# Patient Record
Sex: Female | Born: 1985 | Race: White | Hispanic: No | Marital: Married | Smoking: Current every day smoker
Health system: Southern US, Community
[De-identification: ages and names within clinical notes are randomized; demographics above are authoritative.]

## PROBLEM LIST (undated history)

## (undated) ENCOUNTER — Inpatient Hospital Stay (HOSPITAL_COMMUNITY): Payer: Self-pay

## (undated) DIAGNOSIS — Z141 Cystic fibrosis carrier: Secondary | ICD-10-CM

## (undated) DIAGNOSIS — L0591 Pilonidal cyst without abscess: Secondary | ICD-10-CM

## (undated) DIAGNOSIS — F32A Depression, unspecified: Secondary | ICD-10-CM

## (undated) DIAGNOSIS — IMO0002 Reserved for concepts with insufficient information to code with codable children: Secondary | ICD-10-CM

## (undated) DIAGNOSIS — F329 Major depressive disorder, single episode, unspecified: Secondary | ICD-10-CM

## (undated) DIAGNOSIS — F1010 Alcohol abuse, uncomplicated: Secondary | ICD-10-CM

## (undated) DIAGNOSIS — F17200 Nicotine dependence, unspecified, uncomplicated: Secondary | ICD-10-CM

## (undated) DIAGNOSIS — R51 Headache: Secondary | ICD-10-CM

## (undated) DIAGNOSIS — A630 Anogenital (venereal) warts: Secondary | ICD-10-CM

## (undated) HISTORY — DX: Nicotine dependence, unspecified, uncomplicated: F17.200

## (undated) HISTORY — DX: Headache: R51

## (undated) HISTORY — DX: Reserved for concepts with insufficient information to code with codable children: IMO0002

## (undated) HISTORY — DX: Cystic fibrosis carrier: Z14.1

## (undated) HISTORY — DX: Anogenital (venereal) warts: A63.0

## (undated) HISTORY — DX: Pilonidal cyst without abscess: L05.91

## (undated) HISTORY — PX: NO PAST SURGERIES: SHX2092

---
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
## 2007-11-15 ENCOUNTER — Emergency Department (HOSPITAL_BASED_OUTPATIENT_CLINIC_OR_DEPARTMENT_OTHER): Admission: EM | Admit: 2007-11-15 | Discharge: 2007-11-15 | Payer: Self-pay | Admitting: Emergency Medicine

## 2009-01-29 ENCOUNTER — Emergency Department (HOSPITAL_COMMUNITY): Admission: EM | Admit: 2009-01-29 | Discharge: 2009-01-29 | Payer: Self-pay | Admitting: Emergency Medicine

## 2009-01-31 ENCOUNTER — Emergency Department (HOSPITAL_COMMUNITY): Admission: EM | Admit: 2009-01-31 | Discharge: 2009-01-31 | Payer: Self-pay | Admitting: Emergency Medicine

## 2009-02-25 ENCOUNTER — Emergency Department (HOSPITAL_COMMUNITY): Admission: EM | Admit: 2009-02-25 | Discharge: 2009-02-25 | Payer: Self-pay | Admitting: Emergency Medicine

## 2010-04-04 LAB — WOUND CULTURE

## 2010-05-12 ENCOUNTER — Inpatient Hospital Stay (HOSPITAL_COMMUNITY)
Admission: RE | Admit: 2010-05-12 | Discharge: 2010-05-14 | DRG: 775 | Disposition: A | Discharge status: Home / Self Care | Payer: Medicaid Other | Source: Ambulatory Visit | Attending: Obstetrics and Gynecology | Admitting: Obstetrics and Gynecology

## 2010-05-12 ENCOUNTER — Inpatient Hospital Stay (HOSPITAL_COMMUNITY): Admission: RE | Admit: 2010-05-12 | Payer: Self-pay | Source: Ambulatory Visit | Admitting: Obstetrics and Gynecology

## 2010-05-12 DIAGNOSIS — O480 Post-term pregnancy: Principal | ICD-10-CM | POA: Diagnosis present

## 2010-05-12 LAB — CBC
HCT: 31 % — ABNORMAL LOW (ref 36.0–46.0)
Hemoglobin: 10 g/dL — ABNORMAL LOW (ref 12.0–15.0)
MCH: 27.1 pg (ref 26.0–34.0)
MCHC: 32.3 g/dL (ref 30.0–36.0)
MCV: 84 fL (ref 78.0–100.0)
Platelets: 217 10*3/uL (ref 150–400)
RBC: 3.69 MIL/uL — ABNORMAL LOW (ref 3.87–5.11)
RDW: 14.3 % (ref 11.5–15.5)
WBC: 7.7 10*3/uL (ref 4.0–10.5)

## 2010-05-12 LAB — RPR: RPR Ser Ql: NONREACTIVE

## 2010-05-13 LAB — CBC
HCT: 24.5 % — ABNORMAL LOW (ref 36.0–46.0)
Hemoglobin: 7.9 g/dL — ABNORMAL LOW (ref 12.0–15.0)
MCH: 26.9 pg (ref 26.0–34.0)
MCV: 84.5 fL (ref 78.0–100.0)
RBC: 2.9 MIL/uL — ABNORMAL LOW (ref 3.87–5.11)

## 2010-05-16 ENCOUNTER — Inpatient Hospital Stay (HOSPITAL_COMMUNITY)
Admission: AD | Admit: 2010-05-16 | Discharge: 2010-05-16 | Disposition: A | Discharge status: Home / Self Care | Payer: Medicaid Other | Source: Ambulatory Visit | Attending: Obstetrics and Gynecology | Admitting: Obstetrics and Gynecology

## 2010-05-16 DIAGNOSIS — N949 Unspecified condition associated with female genital organs and menstrual cycle: Secondary | ICD-10-CM | POA: Insufficient documentation

## 2010-05-16 DIAGNOSIS — O9989 Other specified diseases and conditions complicating pregnancy, childbirth and the puerperium: Secondary | ICD-10-CM

## 2010-05-16 DIAGNOSIS — O99893 Other specified diseases and conditions complicating puerperium: Secondary | ICD-10-CM | POA: Insufficient documentation

## 2010-05-24 NOTE — Discharge Summary (Signed)
[name], [name]              ACCOUNT NO.:  [id number]  MEDICAL RECORD NO.:  [id number]           PATIENT TYPE:  I  LOCATION:  9109                          FACILITY:  [facility]  PHYSICIAN:  Huel Cote, M.D. DATE OF BIRTH:  [birth year]  DATE OF ADMISSION:  05/12/2010 DATE OF DISCHARGE:  05/14/2010                              DISCHARGE SUMMARY   DISCHARGE DIAGNOSES: 1. Term pregnancy at 40-3/7 weeks. 2. Status post normal spontaneous delivery.  DISCHARGE MEDICATIONS: 1. Motrin 600 mg p.o. every 6 hours p.r.n. 2. Percocet 1-2 tablets p.o. every 4 hours p.r.n.  DISCHARGE FOLLOWUP:  The patient is to follow up in the office in 6 weeks for her usual postpartum exam.  HOSPITAL COURSE:  The patient is a 25 year old G1, P0 who was admitted at 40-3/[redacted] weeks gestation for induction of labor given post due date. 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  Father of baby was not tested.  History of alcoholism in the past but the patient has been sober for 2 years.  She is also a smoker but has decreased to about 10 cigarettes a day.  She does have a history of depression in the past and a history of suicide attempts in the past, however, these were not an active issue during the pregnancy and she had required no medications during the pregnancy.  PRENATAL LABS:  A+ antibody negative, rubella immune, hepatitis B surface antigen negative, HIV negative, GC negative, Chlamydia negative, RPR nonreactive, cystic fibrosis positive, 1-hour Glucola 78, first trimester screen negative, group B strep negative.  PAST OBSTETRICAL HISTORY:  None.  PAST GYN HISTORY:  An abnormal Pap smear in 2009.  This has resolved by the Pap smear done this pregnancy.  PAST SURGICAL HISTORY:  Pilonidal cyst in 2010.  PAST MEDICAL HISTORY:  History of alcoholism but the patient is sober for 2 years, history of migraines, also a remote history of depression and suicide  attempt.  ALLERGIES:  SULFA.  MEDICATIONS:  Prenatal vitamins.  The patient is single with father of the baby involved.  She does not drink or use drugs.  She does smoke.  On admission, she was afebrile with stable vital signs.  Fetal heart rate was reactive.  Cervix was 70, 1-2, and a -1 station.  She had low- dose Pitocin begun and rupture of membranes performed and progressed very quickly to complete dilation.  She pushed well with a normal spontaneous vaginal delivery of a viable female infant over small first- degree laceration.  There is nuchal cord x1 reduced over the head. Apgar's were 9 and 9, weight was 6 pounds 10 ounces.  There were small bilateral labial lacerations which were repaired as well as a first- degree perineal laceration with 3-0 Vicryl for hemostasis.  She was admitted for routine postpartum care.  On postpartum day #1, hemoglobin was 7.9 down from 10, she was ambulating well and having no symptoms of anemia.  Fundus was firm and her lochia had normalized.  On postpartum day #2, she was still doing well.  She was slightly tearful and we had a long  discussion about postpartum depression and baby blues.  She understands she is to call if her depression seems to be getting worse after the first week of being home.  She is afebrile with stable vital signs.  Her fundus was firm.  She was given prescriptions for Motrin and Percocet and instructed to follow up in the office in 6 weeks and her family was also made aware of any issues that they felt were with depression.  She was also scheduled to have a social work consult later today before discharge.     Huel Cote, M.D.     KR/MEDQ  D:  05/14/2010  T:  05/14/2010  Job:  [id number]  Electronically Signed by Huel Cote M.D. on 05/24/2010 09:15:12 AM

## 2010-06-29 ENCOUNTER — Emergency Department (HOSPITAL_COMMUNITY)
Admission: EM | Admit: 2010-06-29 | Discharge: 2010-06-29 | Disposition: A | Discharge status: Home / Self Care | Payer: Medicaid Other | Attending: Emergency Medicine | Admitting: Emergency Medicine

## 2010-06-29 DIAGNOSIS — H53149 Visual discomfort, unspecified: Secondary | ICD-10-CM | POA: Insufficient documentation

## 2010-06-29 DIAGNOSIS — R110 Nausea: Secondary | ICD-10-CM | POA: Insufficient documentation

## 2010-06-29 DIAGNOSIS — R51 Headache: Secondary | ICD-10-CM | POA: Insufficient documentation

## 2010-06-29 LAB — POCT I-STAT, CHEM 8
BUN: 20 mg/dL (ref 6–23)
Creatinine, Ser: 0.7 mg/dL (ref 0.4–1.2)
Sodium: 141 mEq/L (ref 135–145)
TCO2: 24 mmol/L (ref 0–100)

## 2010-09-29 ENCOUNTER — Emergency Department (HOSPITAL_COMMUNITY)
Admission: EM | Admit: 2010-09-29 | Discharge: 2010-09-29 | Disposition: A | Discharge status: Home / Self Care | Payer: Medicaid Other | Attending: Emergency Medicine | Admitting: Emergency Medicine

## 2010-09-29 DIAGNOSIS — G43909 Migraine, unspecified, not intractable, without status migrainosus: Secondary | ICD-10-CM | POA: Insufficient documentation

## 2010-09-29 DIAGNOSIS — H53149 Visual discomfort, unspecified: Secondary | ICD-10-CM | POA: Insufficient documentation

## 2012-01-18 NOTE — L&D Delivery Note (Signed)
Delivery Note At 1:07 PM a viable female was delivered via Vaginal, Spontaneous Delivery (Presentation: Left Occiput Anterior).  APGAR: 6, 8; weight pending.   Placenta status: Intact, Spontaneous.  Cord: 3 vessels with the following complications: None.  Baby a little lethargic at first but responded well to stimulation and blow-by O2.  O2 sats normalized.  Baby delivered with an enormous amount of light meconium stained fluid.  Anesthesia: Epidural  Episiotomy: None Lacerations: None Suture Repair: n/a Est. Blood Loss (mL): 400cc  Mom to postpartum.  Baby to stay with mother.  Oliver Pila 08/03/2012, 1:25 PM

## 2012-03-07 ENCOUNTER — Other Ambulatory Visit: Payer: Self-pay

## 2012-03-07 LAB — OB RESULTS CONSOLE RUBELLA ANTIBODY, IGM: Rubella: IMMUNE

## 2012-03-07 LAB — OB RESULTS CONSOLE HIV ANTIBODY (ROUTINE TESTING): HIV: NONREACTIVE

## 2012-03-07 LAB — OB RESULTS CONSOLE ABO/RH: RH Type: POSITIVE

## 2012-03-07 LAB — OB RESULTS CONSOLE RPR: RPR: NONREACTIVE

## 2012-03-13 ENCOUNTER — Other Ambulatory Visit (HOSPITAL_COMMUNITY): Payer: Self-pay | Admitting: Obstetrics and Gynecology

## 2012-03-13 DIAGNOSIS — Z141 Cystic fibrosis carrier: Secondary | ICD-10-CM

## 2012-03-15 ENCOUNTER — Ambulatory Visit (HOSPITAL_COMMUNITY): Payer: Medicaid Other

## 2012-03-15 ENCOUNTER — Encounter (HOSPITAL_COMMUNITY): Payer: Self-pay | Admitting: Obstetrics and Gynecology

## 2012-03-26 ENCOUNTER — Encounter (HOSPITAL_COMMUNITY): Payer: Self-pay

## 2012-03-26 ENCOUNTER — Ambulatory Visit (HOSPITAL_COMMUNITY)
Admission: RE | Admit: 2012-03-26 | Discharge: 2012-03-26 | Disposition: A | Discharge status: Home / Self Care | Payer: Medicaid Other | Source: Ambulatory Visit | Attending: Obstetrics and Gynecology | Admitting: Obstetrics and Gynecology

## 2012-03-26 ENCOUNTER — Other Ambulatory Visit: Payer: Self-pay

## 2012-03-26 DIAGNOSIS — Z141 Cystic fibrosis carrier: Secondary | ICD-10-CM

## 2012-03-26 DIAGNOSIS — O3500X0 Maternal care for (suspected) central nervous system malformation or damage in fetus, unspecified, not applicable or unspecified: Secondary | ICD-10-CM | POA: Insufficient documentation

## 2012-03-26 DIAGNOSIS — R772 Abnormality of alphafetoprotein: Secondary | ICD-10-CM

## 2012-03-26 DIAGNOSIS — O350XX0 Maternal care for (suspected) central nervous system malformation in fetus, not applicable or unspecified: Secondary | ICD-10-CM | POA: Insufficient documentation

## 2012-03-26 NOTE — Progress Notes (Signed)
Genetic Counseling  High-Risk Gestation Note  Appointment Date:  03/26/2012 Referred By: Joy Pila, MD Date of Birth:  [birth year] Partner:  [name]    Pregnancy History: [id number] Estimated Date of Delivery: 08/07/12 Estimated Gestational Age: [redacted]w[redacted]d Attending: Eulis Foster, MD    [name] and her partner, [name], were seen for genetic counseling because of an increased risk for fetal Down syndrome based on Quad screen performed through [facility].  They were counseled regarding the Quad screen result and the associated 1 in 141 risk for fetal Down syndrome.  We reviewed chromosomes, nondisjunction, and the common features and variable prognosis of Down syndrome.  In addition, we reviewed the screen negative risks for trisomy 18 (1 in 3,074) and ONTDs (1 in 10,000).  We also discussed other explanations for a screen positive result including: a gestational dating error, differences in maternal metabolism, and normal variation. They understand that this screening is not diagnostic for Down syndrome but provides a risk assessment. Additionally, we discussed that the value of one of the proteins analyzed (DIA) was very elevated (3.50 MoM). This has been associated with an increased risk for growth restriction or poor pregnancy outcome later in pregnancy; therefore, we would recommend offering a follow up ultrasound for fetal growth in the third trimester.  We reviewed other available screening options including noninvasive prenatal testing (NIPT) and detailed ultrasound.  Specifically, we discussed that NIPT analyzes cell free fetal DNA found in the maternal circulation. This test is not diagnostic for chromosome conditions, but can provide information regarding the presence or absence of extra fetal DNA for chromosomes 13, 18, and 21. Thus, it would not identify or rule out all genetic conditions. The reported detection rate is greater than 99% for Trisomy 21, greater  than 98% for Trisomy 18, and is approximately 80% (8 out of 10) for Trisomy 13. The false positive rate is reported to be less than 0.1% for any of these conditions.  In addition, we discussed that ~50-80% of fetuses with Down syndrome, when well visualized, have detectable anomalies or soft markers by detailed ultrasound (~18+ weeks gestation).   This couple was also counseled regarding diagnostic testing via amniocentesis.  We reviewed the approximate 1 in 300-500 risk for complications, including spontaneous pregnancy loss. After consideration of all the options, they elected to proceed with cell free fetal DNA testing ([facility]) and declined amniocentesis. Those results will be available in approximately 8-10 days.  A detailed ultrasound was performed today.  The ultrasound report will be sent under separate cover.  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 CF testing has not yet been performed for her partner.  Cystic fibrosis (CF) is a common genetic condition in the Caucasian population occurring in approximately 1 in 3,300 Caucasian births.  This means approximately 1 in 29 Caucasians is a CF carrier.  Currently over 1,000 mutations have been identified in the CFTR gene; about 70% of Caucasian CF carriers have delta F508.   We spent time reviewing the autosomal recessive inheritance of cystic fibrosis and the 25% chance, when both parents are carriers, to have a child with CF.  We discussed that there are thought to be thousands of mutations which can cause the CF gene to not function properly. CF results in thickened secretions in the lungs, digestive and reproductive systems.  This life-limiting condition is characterized by chronic respiratory  infections necessitating daily chest therapies, pancreatic dysfunction disrupting the body's ability to break down food and extract  nutrients as it should, and possible infertility in males.  With the advent of new therapies, the average lifespan for people with CF is in the 30's.  Symptoms can be considerably variable, and in some cases symptoms may be quite mild.  Sometimes, the specific mutations a person carries can indicate whether their symptoms may be associated with milder or more severe symptoms; this is not always the case. The father of the pregnancy reported [place] ancestry and no known consanguinity to [name]. Prior to carrier screening for [name], the chance for CF in the current pregnancy is approximately 1 in 100 given no known family history of CF for [name].   We discussed the option of carrier screening for [name]. Carrier screening assess for the most common disease causing CFTR mutations. Thus, a negative carrier screen would reduce but not eliminate the chance to be a carrier. In the case that both partners are identified to be carriers, prenatal diagnosis via [facility] or amniocentesis would be available in a future pregnancy. Additionally, preimplantation genetic diagnosis would also be an option for any future pregnancy if mutations were identified in both partners, meaning that an embryo obtained through in vitro fertilization would be tested for CF prior to its implantation into the uterus.  We also discussed that in [place], the newborn screening test will detect CF, but that carriers may come back as false positives. The couple expressed understanding of this information and reported that this was discussed during their previous pregnancy together as well. After careful consideration, [name] declined CF carrier screening at the time of today's visit.   Both family histories were reviewed and found to be noncontributory for birth defects, intellectual disability, recurrent pregnancy loss, or known genetic conditions. Without further information regarding the provided  family history, an accurate genetic risk cannot be calculated. Further genetic counseling is warranted if more information is obtained.  [name] denied exposure to environmental toxins. She reported that she is currently taking Zoloft, which was started recently.  Based on available study data of use on Zoloft during human pregnancy, no apparent association with increased risk of birth defects is suggested. Use of Zoloft and other serotonin reuptake inhibitors late in gestation has been associated with an increased risk of mild transient neonatal syndrome of central nervous system including irritability, vomiting, jitteriness and convulsions, as well as neonatal pulmonary hypertension. These associated symptoms typically do not appear to occur frequently or severely.  She denied the use of alcohol or street drugs. She reported smoking approximately 5 cigarettes per day. The associations of smoking in pregnancy were reviewed and cessation encouraged. She denied significant viral illnesses during the course of her pregnancy. Her medical and surgical histories were noncontributory.   I counseled this couple for approximately 40 minutes regarding the above risks and available options.     Joy Plowman, MS,  Certified Genetic Counselor 03/26/2012

## 2012-03-26 NOTE — Progress Notes (Signed)
[facility] ultrasound  27 yr old G3P1011 at [redacted]w[redacted]d for fetal anatomic survey. Abnormal quad screen with risk of trisomy 21 of 1:141 and elevated inhibin of 3.5MoM. Patient is a cystic fibrosis mutation carrier.  Findings: 1. Single intrauterine pregnancy. 2. Fetal biometry is consistent with dating. 3. Posterior placenta without evidence of previa. 4. Normal amniotic fluid volume. 5. Normal transabdominal cervical length. 6. Normal fetal anatomic survey.  Recommendations: 1. Appropriate fetal growth. 2. Normal fetal anatomic survey. 3. Abnormal quad screen: - patient met with genetic counselor; see separate report - after counseling patient opted for cell free fetal DNA screening which was drawn today - patient declined amniocentesis 4. Elevated inhibin: I discussed the association with fetal aneuploidy. I also discussed the association with increased risk of adverse pregnancy outcomes including increased risk of fetal growth restriction, preeclampsia, stillbirth, preterm birth, and abruption. I discussed these risks are only slightly increased over baseline but heightened surveillance is warranted. I recommend serial fetal growth ultrasounds every 4 weeks. I recommend antenatal testing only in the setting of fetal growth restriction. I recommend close surveillance for the development of signs/symptoms of preterm labor, PPROM, and preeclampsia.   Joy Foster, MD

## 2012-04-05 ENCOUNTER — Telehealth (HOSPITAL_COMMUNITY): Payer: Self-pay | Admitting: MS"

## 2012-04-05 NOTE — Telephone Encounter (Signed)
Called [name] to discuss her [facility], cell free fetal DNA testing. Patient was identified by name and DOB. Testing was offered because of screen positive Down syndrome risk from maternal serum screen. We reviewed that these are within normal limits, showing a less than 1 in 10,000 risk for trisomies 21, 18 and 13. We reviewed that this testing identifies > 99% of pregnancies with trisomy 21, >98% of pregnancies with trisomy 74, and >80% with trisomy 67; the false positive rate is <0.1% for all conditions. She understands that this testing does not identify all genetic conditions. All questions were answered to her satisfaction, she was encouraged to call with additional questions or concerns.  Quinn Plowman, MS Certified Genetic Counselor 04/05/2012 1:55 PM

## 2012-04-19 ENCOUNTER — Ambulatory Visit: Payer: Medicaid Other | Attending: Obstetrics and Gynecology

## 2012-05-22 ENCOUNTER — Inpatient Hospital Stay (HOSPITAL_COMMUNITY)
Admission: AD | Admit: 2012-05-22 | Discharge: 2012-05-22 | Disposition: A | Discharge status: Home / Self Care | Payer: Medicaid Other | Source: Ambulatory Visit | Attending: Obstetrics and Gynecology | Admitting: Obstetrics and Gynecology

## 2012-05-22 ENCOUNTER — Encounter (HOSPITAL_COMMUNITY): Payer: Self-pay | Admitting: *Deleted

## 2012-05-22 DIAGNOSIS — R1013 Epigastric pain: Secondary | ICD-10-CM | POA: Insufficient documentation

## 2012-05-22 DIAGNOSIS — O99891 Other specified diseases and conditions complicating pregnancy: Secondary | ICD-10-CM | POA: Insufficient documentation

## 2012-05-22 DIAGNOSIS — R51 Headache: Secondary | ICD-10-CM | POA: Insufficient documentation

## 2012-05-22 HISTORY — DX: Major depressive disorder, single episode, unspecified: F32.9

## 2012-05-22 HISTORY — DX: Depression, unspecified: F32.A

## 2012-05-22 MED ORDER — BUTALBITAL-APAP-CAFFEINE 50-325-40 MG PO TABS
2.0000 | ORAL_TABLET | Freq: Once | ORAL | Status: AC
  Administered 2012-05-22: 2 via ORAL
  Filled 2012-05-22: qty 2

## 2012-05-22 MED ORDER — ALUM & MAG HYDROXIDE-SIMETH 200-200-20 MG/5ML PO SUSP
30.0000 mL | Freq: Once | ORAL | Status: AC
  Administered 2012-05-22: 30 mL via ORAL
  Filled 2012-05-22: qty 30

## 2012-05-22 MED ORDER — BUTALBITAL-APAP-CAFFEINE 50-325-40 MG PO TABS: 1.0000 | ORAL_TABLET | Freq: Four times a day (QID) | ORAL | Status: DC | PRN

## 2012-05-22 NOTE — MAU Provider Note (Signed)
  History     CSN: [id number]  Arrival date and time: 05/22/12 2139   First Provider Initiated Contact with Patient 05/22/12 2243      Chief Complaint  Patient presents with  . Migraine   HPI This is a 27 y.o. female at [redacted]w[redacted]d who presents with c/o headache, mostly left temporal.  Denies nausea but does have some photophobia. States headaches usually come from tooth pain, though she does not have tooth pain now.  Took 3 Goody Powders at home without relief.  Stomach hurts since taking them. Epigastric  RN Note: Migraine that started around 630 pm.   OB History   Grav Para Term Preterm Abortions TAB SAB Ect Mult Living   3 1 1  0 1 1 0 0 0 1      Past Medical History  Diagnosis Date  . Depression     No past surgical history on file.  History reviewed. No pertinent family history.  History  Substance Use Topics  . Smoking status: Current Every Day Smoker -- 0.25 packs/day    Types: Cigarettes  . Smokeless tobacco: Not on file  . Alcohol Use: No     Comment: History of alcoholism goes to AA    Allergies:  Allergies  Allergen Reactions  . Sulfa Antibiotics Other (See Comments)    Unknown reaction.    Prescriptions prior to admission  Medication Sig Dispense Refill  . Aspirin-Acetaminophen-Caffeine (GOODYS EXTRA STRENGTH) [phone] MG PACK Take by mouth.      . IRON PO Take by mouth.      . sertraline (ZOLOFT) 25 MG tablet Take 25 mg by mouth at bedtime.         Review of Systems  Constitutional: Negative for fever, chills and malaise/fatigue.  HENT: Negative for ear pain.   Eyes: Positive for photophobia. Negative for blurred vision.  Cardiovascular: Negative for chest pain.  Gastrointestinal: Negative for nausea, vomiting, abdominal pain, diarrhea and constipation.  Genitourinary: Negative for dysuria.  Neurological: Positive for headaches. Negative for dizziness and weakness.   Physical Exam   Blood pressure 123/63, pulse 79, temperature 98.6 F (37  C), temperature source Oral, resp. rate 18, height 5\' 4"  (1.626 m), weight 142 lb (64.411 kg), last menstrual period 11/01/2011, SpO2 100.00%.  Physical Exam  Constitutional: She is oriented to person, place, and time. She appears well-developed and well-nourished. No distress (though appears uncomfortable).  HENT:  Head: Normocephalic.  Neck: Normal range of motion. Neck supple.  Cardiovascular: Normal rate.   Respiratory: Effort normal.  GI: Soft. She exhibits no distension. There is no tenderness. There is no rebound and no guarding.  Musculoskeletal: Normal range of motion.  Neurological: She is alert and oriented to person, place, and time.  Skin: Skin is warm and dry.  Psychiatric: She has a normal mood and affect.    MAU Course  Procedures  MDM Fioricet given (2 tab) with good relief. Mylanta given for stomach. Advised not to use aspirin products during pregnancy.   Assessment and Plan  A:  SIUP at [redacted]w[redacted]d       Headache, probably migraine      Epigastric pain, probably due to aspirin intake      Reassuring fetal heart rate pattern  P:  Discussed with Dr Senaida Ores       Rx Fioricet for PRN home use      Advised not to take ASA      Followup in office  [facility] 05/22/2012, 11:24 PM

## 2012-05-22 NOTE — MAU Note (Signed)
Migraine that started around 630 pm.

## 2012-05-25 ENCOUNTER — Inpatient Hospital Stay (HOSPITAL_COMMUNITY)
Admission: AD | Admit: 2012-05-25 | Discharge: 2012-05-26 | Disposition: A | Discharge status: Home / Self Care | Payer: Medicaid Other | Source: Ambulatory Visit | Attending: Obstetrics and Gynecology | Admitting: Obstetrics and Gynecology

## 2012-05-25 DIAGNOSIS — R51 Headache: Secondary | ICD-10-CM | POA: Insufficient documentation

## 2012-05-25 DIAGNOSIS — K044 Acute apical periodontitis of pulpal origin: Secondary | ICD-10-CM | POA: Insufficient documentation

## 2012-05-25 DIAGNOSIS — O99891 Other specified diseases and conditions complicating pregnancy: Secondary | ICD-10-CM | POA: Insufficient documentation

## 2012-05-25 DIAGNOSIS — L0591 Pilonidal cyst without abscess: Secondary | ICD-10-CM | POA: Insufficient documentation

## 2012-05-25 DIAGNOSIS — K047 Periapical abscess without sinus: Secondary | ICD-10-CM

## 2012-05-25 NOTE — MAU Note (Signed)
Pt had tooth extractions few months ago. Has had headache for 3 days. Feels something is going on behind nose like cyst or something. Nose very sore to touch. Has hx of cystic acne and pilonidal cyst and one recently on back.

## 2012-05-26 ENCOUNTER — Encounter (HOSPITAL_COMMUNITY): Payer: Self-pay | Admitting: *Deleted

## 2012-05-26 DIAGNOSIS — K044 Acute apical periodontitis of pulpal origin: Secondary | ICD-10-CM

## 2012-05-26 LAB — CBC WITH DIFFERENTIAL/PLATELET
Eosinophils Relative: 3 % (ref 0–5)
HCT: 24.4 % — ABNORMAL LOW (ref 36.0–46.0)
Lymphocytes Relative: 23 % (ref 12–46)
Lymphs Abs: 2 10*3/uL (ref 0.7–4.0)
MCV: 84.7 fL (ref 78.0–100.0)
Monocytes Absolute: 0.6 10*3/uL (ref 0.1–1.0)
RBC: 2.88 MIL/uL — ABNORMAL LOW (ref 3.87–5.11)
RDW: 13.3 % (ref 11.5–15.5)
WBC: 9 10*3/uL (ref 4.0–10.5)

## 2012-05-26 MED ORDER — OXYCODONE-ACETAMINOPHEN 5-325 MG PO TABS
2.0000 | ORAL_TABLET | Freq: Once | ORAL | Status: AC
  Administered 2012-05-26: 2 via ORAL
  Filled 2012-05-26: qty 2

## 2012-05-26 MED ORDER — OXYCODONE-ACETAMINOPHEN 5-325 MG PO TABS: 1.0000 | ORAL_TABLET | Freq: Four times a day (QID) | ORAL | Status: DC | PRN

## 2012-05-26 NOTE — MAU Provider Note (Signed)
Chief Complaint:  Headache  First Provider Initiated Contact with Patient 05/26/12 0250     HPI: [name] is a 27 y.o. G3P1011 at [redacted]w[redacted]d who presents to maternity admissions reporting severe pain in her gums above and around her left front tooth, upper lip and tenderness behind her nose. She also think that she has a cyst in her upper lip or behind her nose. She reported to the RN that she tried to lance the cyst with a needle tonight.   She has very poor dentition and has been seeing a dentist in [facility] for extractions of decayed, broken teeth. Yesterday morning the pain became severe, w/ minimal improvement w/ Vicodin and Ibuprofen. Pt states she has called her dentist several times in the past two days w/out a return phone call.   Denies fever, chills, drainage from cyst, contractions, leakage of fluid or vaginal bleeding. Good fetal movement.   Past Medical History: Past Medical History  Diagnosis Date  . Depression     Past obstetric history: OB History   Grav Para Term Preterm Abortions TAB SAB Ect Mult Living   3 1 1  0 1 1 0 0 0 1     # Outc Date GA Lbr Len/2nd Wgt Sex Del Anes PTL Lv   1 TRM            2 TAB            3 CUR               Past Surgical History: History reviewed. No pertinent past surgical history.  Family History: History reviewed. No pertinent family history.  Social History: History  Substance Use Topics  . Smoking status: Current Every Day Smoker -- 0.25 packs/day    Types: Cigarettes  . Smokeless tobacco: Not on file  . Alcohol Use: No     Comment: History of alcoholism goes to AA    Allergies:  Allergies  Allergen Reactions  . Sulfa Antibiotics Other (See Comments)    Unknown reaction.    Meds:  Prescriptions prior to admission  Medication Sig Dispense Refill  . butalbital-acetaminophen-caffeine (FIORICET) 50-325-40 MG per tablet Take 1-2 tablets by mouth every 6 (six) hours as needed for headache.  20 tablet  0  . IRON PO  Take by mouth.      . sertraline (ZOLOFT) 25 MG tablet Take 25 mg by mouth at bedtime.         ROS: Pertinent findings in history of present illness.  Physical Exam  Blood pressure 107/58, pulse 88, temperature 98.1 F (36.7 C), resp. rate 20, height 5\' 4"  (1.626 m), weight 64.501 kg (142 lb 3.2 oz), last menstrual period 11/01/2011, SpO2 100.00%. GENERAL: Well-developed, well-nourished female in mild distress.  HEENT: normocephalic. Swollen upper lip and gums of upper front teeth. Gums, upper lip exquisitely tender. 1.5 cm cyst vs abscess to left of upper frenulum. No drainage. Tender around base of nose. No frontal or maxillary sinus tenderness. All teeth in upper jaw decayed and broken or absent.  HEART: normal rate RESP: normal effort ABDOMEN: Soft, non-tender, gravid appropriate for gestational age NEURO: alert and oriented SPECULUM EXAM: Deferred    FHT:  Baseline 140 , moderate variability, accelerations present, no decelerations Contractions: none   Labs: Results for orders placed during the hospital encounter of 05/25/12 (from the past 24 hour(s))  CBC WITH DIFFERENTIAL     Status: Abnormal   Collection Time    05/26/12  1:35  AM      Result Value Range   WBC 9.0  4.0 - 10.5 K/uL   RBC 2.88 (*) 3.87 - 5.11 MIL/uL   Hemoglobin 8.3 (*) 12.0 - 15.0 g/dL   HCT 16.1 (*) 09.6 - 04.5 %   MCV 84.7  78.0 - 100.0 fL   MCH 28.8  26.0 - 34.0 pg   MCHC 34.0  30.0 - 36.0 g/dL   RDW 40.9  81.1 - 91.4 %   Platelets 208  150 - 400 K/uL   Neutrophils Relative 68  43 - 77 %   Neutro Abs 6.1  1.7 - 7.7 K/uL   Lymphocytes Relative 23  12 - 46 %   Lymphs Abs 2.0  0.7 - 4.0 K/uL   Monocytes Relative 7  3 - 12 %   Monocytes Absolute 0.6  0.1 - 1.0 K/uL   Eosinophils Relative 3  0 - 5 %   Eosinophils Absolute 0.2  0.0 - 0.7 K/uL   Basophils Relative 0  0 - 1 %   Basophils Absolute 0.0  0.0 - 0.1 K/uL    Imaging:  No results found.  MAU Course: Pain decreased to 3/10 w/ percocet. Pt  very strongly encouraged to F/U w/ dentist.  Assessment: 1. Dental infection   2. Other current maternal conditions classifiable elsewhere, antepartum     Plan: Discharge home Labor precautions and fetal kick counts.  List given. Instructed to go to Urgent care tomorrow if not able to get into dentist in next few days. Go to ED for fever >100.4.      Follow-up Information   Schedule an appointment as soon as possible for a visit with Dentist.      Follow up with [facility]. (As needed if symptoms worsen before dental appointment)    Contact information:   [address] [place] [address] [phone]       Medication List    TAKE these medications       butalbital-acetaminophen-caffeine 50-325-40 MG per tablet  Commonly known as:  FIORICET  Take 1-2 tablets by mouth every 6 (six) hours as needed for headache.     IRON PO  Take by mouth.     oxyCODONE-acetaminophen 5-325 MG per tablet  Commonly known as:  ROXICET  Take 1-2 tablets by mouth every 6 (six) hours as needed for pain.     sertraline 25 MG tablet  Commonly known as:  ZOLOFT  Take 25 mg by mouth at bedtime.        [place], CNM 05/26/2012 3:50 AM

## 2012-05-26 NOTE — MAU Note (Addendum)
PT SAYS SHE WAS HERE 2 DAYS AGO WITH H/A- GIVEN PAIN MED - NO RELIEF..  WENT TO DR MEISINGER    TODAY.   PT  IS IN PROCESS OF HAVING ALL TEETH EXTRACTED-  HER GUMS HURT-  TONIGHT  FELT LIKE SHE HAD PCKET OF FLUID ON HER GUM- SHE STERILIZED NEEDLE AND STUCK IT IN GUM - DRANIAGE CLEAR- NO ODOR.   FEELS LIKE FACE IS SWOLLEN  AND SHE THINKS SHE HAS  A CYST BEHINND HER NOSE .  HER L   SIDE OF NOSE IS SWOLLEN.    NO SORE THROAT,  EARS DON'T HURT.,  NOSE FEELS  CONGESTED   FROM CRYING.   HAS HAD TEETH REOVED   2013-2014-  WAS ON AMOXICILLIN

## 2012-06-26 LAB — OB RESULTS CONSOLE GBS: GBS: NEGATIVE

## 2012-07-24 ENCOUNTER — Encounter (HOSPITAL_COMMUNITY): Payer: Self-pay | Admitting: *Deleted

## 2012-07-24 ENCOUNTER — Telehealth (HOSPITAL_COMMUNITY): Payer: Self-pay | Admitting: *Deleted

## 2012-07-24 NOTE — Telephone Encounter (Signed)
Preadmission screen  

## 2012-08-02 NOTE — H&P (Signed)
[name] is a 27 y.o. female G3P0010 at 31 3/7 weeks (EDD 08/07/12 by LMP c/w 18 week [place]) presenting for IOL at term with a favorable cervix.  Prenatal care was complicated by a late entry at 17 weeks.  Pt also is a smoker, has cut down to 5 cigs/day.  She has a h/o alcoholism but has been sober many years.  Her quad screen demonstrated an increased down's syndrome risk of 1/141, a MFM consult was reassuring for a normal Harmony test and a normal ultrasound, pt declined amniocentesis.  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  Maternal Medical History:  Contractions: Frequency: irregular.   Perceived severity is mild.    Fetal activity: Perceived fetal activity is normal.    Prenatal Complications - Diabetes: none.    OB History   Grav Para Term Preterm Abortions TAB SAB Ect Mult Living   3 1 1  0 1 1 0 0 0 1    EAB x1  2012 NSVD 6#11oz  Past Medical History  Diagnosis Date  . Depression   . Cystic fibrosis carrier   . Headache(784.0)   . Abnormal Pap smear   . Pilonidal cyst   . HPV (human papilloma virus) anogenital infection    No past surgical history on file. Family History: family history includes Asthma in her mother; Cancer in her maternal grandmother, paternal grandfather, and paternal uncle; Diabetes in her maternal grandfather and maternal grandmother; and Hypertension in her mother. Social History:  reports that she has been smoking Cigarettes.  She has been smoking about 0.25 packs per day. She does not have any smokeless tobacco history on file. She reports that she does not drink alcohol or use illicit drugs.   Prenatal Transfer Tool  Maternal Diabetes: No Genetic Screening: Abnormal:  Results: Elevated risk of Trisomy 21--normal Harmony Maternal Ultrasounds/Referrals: Normal Fetal Ultrasounds or other Referrals:  Referred to Materal Fetal Medicine  Maternal Substance Abuse:  Yes:  Type: Smoker Significant Maternal  Medications:  None Significant Maternal Lab Results:  None Other Comments:  None  ROS    Last menstrual period 11/01/2011. Maternal Exam:  Uterine Assessment: Contraction strength is mild.  Contraction frequency is irregular.   Abdomen: Patient reports no abdominal tenderness. Fetal presentation: vertex  Introitus: Normal vulva. Normal vagina.  Pelvis: adequate for delivery.      Physical Exam  Constitutional: She is oriented to person, place, and time. She appears well-developed and well-nourished.  Cardiovascular: Normal rate and regular rhythm.   Respiratory: Effort normal and breath sounds normal.  GI: Soft.  Genitourinary: Vagina normal.  Gravid EFW 7 1/2-8 lbs  Neurological: She is alert and oriented to person, place, and time.  Psychiatric: She has a normal mood and affect. Her behavior is normal.    Prenatal labs: ABO, Rh:  A positive Antibody:  negative Rubella:  immune RPR:   NR HBsAg:   Neg HIV:   NR GBS:   Negative One hour GTT 78 Quad screen was positive for increased DSR 1:141 but Harmony negative CF carrier  Assessment/Plan: Pt admitted for IOL at term, will start pitocin and AROM to augment.   Oliver Pila 08/02/2012, 8:45 PM

## 2012-08-03 ENCOUNTER — Encounter (HOSPITAL_COMMUNITY): Payer: Self-pay

## 2012-08-03 ENCOUNTER — Inpatient Hospital Stay (HOSPITAL_COMMUNITY): Payer: Medicaid Other | Admitting: Anesthesiology

## 2012-08-03 ENCOUNTER — Inpatient Hospital Stay (HOSPITAL_COMMUNITY)
Admission: RE | Admit: 2012-08-03 | Discharge: 2012-08-05 | DRG: 775 | Disposition: A | Discharge status: Home / Self Care | Payer: Medicaid Other | Source: Ambulatory Visit | Attending: Obstetrics and Gynecology | Admitting: Obstetrics and Gynecology

## 2012-08-03 ENCOUNTER — Encounter (HOSPITAL_COMMUNITY): Payer: Self-pay | Admitting: Anesthesiology

## 2012-08-03 VITALS — BP 125/76 | HR 78 | Temp 98.2°F | Resp 18 | Ht 65.0 in | Wt 157.0 lb

## 2012-08-03 DIAGNOSIS — Z141 Cystic fibrosis carrier: Secondary | ICD-10-CM

## 2012-08-03 DIAGNOSIS — O99334 Smoking (tobacco) complicating childbirth: Principal | ICD-10-CM | POA: Diagnosis present

## 2012-08-03 DIAGNOSIS — D649 Anemia, unspecified: Secondary | ICD-10-CM | POA: Diagnosis not present

## 2012-08-03 DIAGNOSIS — O9903 Anemia complicating the puerperium: Secondary | ICD-10-CM | POA: Diagnosis not present

## 2012-08-03 LAB — CBC
HCT: 23.8 % — ABNORMAL LOW (ref 36.0–46.0)
Hemoglobin: 7.9 g/dL — ABNORMAL LOW (ref 12.0–15.0)
MCH: 24.8 pg — ABNORMAL LOW (ref 26.0–34.0)
MCHC: 33.2 g/dL (ref 30.0–36.0)
MCV: 74.6 fL — ABNORMAL LOW (ref 78.0–100.0)

## 2012-08-03 LAB — TYPE AND SCREEN
ABO/RH(D): A POS
Antibody Screen: NEGATIVE

## 2012-08-03 LAB — ABO/RH: ABO/RH(D): A POS

## 2012-08-03 MED ORDER — LANOLIN HYDROUS EX OINT: TOPICAL_OINTMENT | CUTANEOUS | Status: DC | PRN

## 2012-08-03 MED ORDER — LACTATED RINGERS IV SOLN: 500.0000 mL | INTRAVENOUS | Status: DC | PRN

## 2012-08-03 MED ORDER — LIDOCAINE HCL (PF) 1 % IJ SOLN
INTRAMUSCULAR | Status: DC | PRN
  Administered 2012-08-03 (×2): 9 mL

## 2012-08-03 MED ORDER — TERBUTALINE SULFATE 1 MG/ML IJ SOLN: 0.2500 mg | Freq: Once | INTRAMUSCULAR | Status: DC | PRN

## 2012-08-03 MED ORDER — EPHEDRINE 5 MG/ML INJ
10.0000 mg | INTRAVENOUS | Status: DC | PRN
  Filled 2012-08-03: qty 2
  Filled 2012-08-03: qty 4

## 2012-08-03 MED ORDER — BENZOCAINE-MENTHOL 20-0.5 % EX AERO: 1.0000 "application " | INHALATION_SPRAY | CUTANEOUS | Status: DC | PRN

## 2012-08-03 MED ORDER — ONDANSETRON HCL 4 MG/2ML IJ SOLN: 4.0000 mg | Freq: Four times a day (QID) | INTRAMUSCULAR | Status: DC | PRN

## 2012-08-03 MED ORDER — DIBUCAINE 1 % RE OINT: 1.0000 "application " | TOPICAL_OINTMENT | RECTAL | Status: DC | PRN

## 2012-08-03 MED ORDER — PHENYLEPHRINE 40 MCG/ML (10ML) SYRINGE FOR IV PUSH (FOR BLOOD PRESSURE SUPPORT)
80.0000 ug | PREFILLED_SYRINGE | INTRAVENOUS | Status: DC | PRN
  Filled 2012-08-03: qty 2

## 2012-08-03 MED ORDER — OXYTOCIN 40 UNITS IN LACTATED RINGERS INFUSION - SIMPLE MED: 62.5000 mL/h | INTRAVENOUS | Status: DC

## 2012-08-03 MED ORDER — EPHEDRINE 5 MG/ML INJ
10.0000 mg | INTRAVENOUS | Status: DC | PRN
  Filled 2012-08-03: qty 2

## 2012-08-03 MED ORDER — CITRIC ACID-SODIUM CITRATE 334-500 MG/5ML PO SOLN: 30.0000 mL | ORAL | Status: DC | PRN

## 2012-08-03 MED ORDER — ONDANSETRON HCL 4 MG PO TABS: 4.0000 mg | ORAL_TABLET | ORAL | Status: DC | PRN

## 2012-08-03 MED ORDER — LACTATED RINGERS IV SOLN
INTRAVENOUS | Status: DC
  Administered 2012-08-03: 09:00:00 via INTRAVENOUS

## 2012-08-03 MED ORDER — WITCH HAZEL-GLYCERIN EX PADS: 1.0000 "application " | MEDICATED_PAD | CUTANEOUS | Status: DC | PRN

## 2012-08-03 MED ORDER — IBUPROFEN 600 MG PO TABS
600.0000 mg | ORAL_TABLET | Freq: Four times a day (QID) | ORAL | Status: DC
  Administered 2012-08-03 – 2012-08-05 (×7): 600 mg via ORAL
  Filled 2012-08-03 (×7): qty 1

## 2012-08-03 MED ORDER — ACETAMINOPHEN 325 MG PO TABS: 650.0000 mg | ORAL_TABLET | ORAL | Status: DC | PRN

## 2012-08-03 MED ORDER — DIPHENHYDRAMINE HCL 25 MG PO CAPS: 25.0000 mg | ORAL_CAPSULE | Freq: Four times a day (QID) | ORAL | Status: DC | PRN

## 2012-08-03 MED ORDER — LIDOCAINE HCL (PF) 1 % IJ SOLN
30.0000 mL | INTRAMUSCULAR | Status: DC | PRN
  Filled 2012-08-03 (×2): qty 30

## 2012-08-03 MED ORDER — LACTATED RINGERS IV SOLN
500.0000 mL | Freq: Once | INTRAVENOUS | Status: AC
  Administered 2012-08-03: 500 mL via INTRAVENOUS

## 2012-08-03 MED ORDER — ZOLPIDEM TARTRATE 5 MG PO TABS: 5.0000 mg | ORAL_TABLET | Freq: Every evening | ORAL | Status: DC | PRN

## 2012-08-03 MED ORDER — FENTANYL 2.5 MCG/ML BUPIVACAINE 1/10 % EPIDURAL INFUSION (WH - ANES)
INTRAMUSCULAR | Status: DC | PRN
  Administered 2012-08-03: 14 mL/h via EPIDURAL

## 2012-08-03 MED ORDER — SIMETHICONE 80 MG PO CHEW: 80.0000 mg | CHEWABLE_TABLET | ORAL | Status: DC | PRN

## 2012-08-03 MED ORDER — PRENATAL MULTIVITAMIN CH
1.0000 | ORAL_TABLET | Freq: Every day | ORAL | Status: DC
  Administered 2012-08-04 – 2012-08-05 (×2): 1 via ORAL
  Filled 2012-08-03 (×2): qty 1

## 2012-08-03 MED ORDER — ONDANSETRON HCL 4 MG/2ML IJ SOLN
4.0000 mg | INTRAMUSCULAR | Status: DC | PRN
Start: 2012-08-03 — End: 2012-08-05

## 2012-08-03 MED ORDER — SERTRALINE HCL 25 MG PO TABS
25.0000 mg | ORAL_TABLET | Freq: Every day | ORAL | Status: DC
  Administered 2012-08-03 – 2012-08-04 (×2): 25 mg via ORAL
  Filled 2012-08-03 (×2): qty 1

## 2012-08-03 MED ORDER — OXYCODONE-ACETAMINOPHEN 5-325 MG PO TABS
1.0000 | ORAL_TABLET | ORAL | Status: DC | PRN
  Administered 2012-08-03 – 2012-08-04 (×2): 1 via ORAL
  Administered 2012-08-04: 2 via ORAL
  Administered 2012-08-04 – 2012-08-05 (×4): 1 via ORAL
  Filled 2012-08-03 (×2): qty 1
  Filled 2012-08-03: qty 2
  Filled 2012-08-03 (×4): qty 1

## 2012-08-03 MED ORDER — OXYTOCIN 40 UNITS IN LACTATED RINGERS INFUSION - SIMPLE MED
1.0000 m[IU]/min | INTRAVENOUS | Status: DC
  Administered 2012-08-03: 2 m[IU]/min via INTRAVENOUS
  Filled 2012-08-03: qty 1000

## 2012-08-03 MED ORDER — SENNOSIDES-DOCUSATE SODIUM 8.6-50 MG PO TABS
2.0000 | ORAL_TABLET | Freq: Every day | ORAL | Status: DC
  Administered 2012-08-03 – 2012-08-04 (×2): 2 via ORAL

## 2012-08-03 MED ORDER — DIPHENHYDRAMINE HCL 50 MG/ML IJ SOLN: 12.5000 mg | INTRAMUSCULAR | Status: DC | PRN

## 2012-08-03 MED ORDER — IBUPROFEN 600 MG PO TABS
600.0000 mg | ORAL_TABLET | Freq: Four times a day (QID) | ORAL | Status: DC | PRN
  Administered 2012-08-03: 600 mg via ORAL
  Filled 2012-08-03: qty 1

## 2012-08-03 MED ORDER — TETANUS-DIPHTH-ACELL PERTUSSIS 5-2.5-18.5 LF-MCG/0.5 IM SUSP: 0.5000 mL | Freq: Once | INTRAMUSCULAR | Status: DC

## 2012-08-03 MED ORDER — PHENYLEPHRINE 40 MCG/ML (10ML) SYRINGE FOR IV PUSH (FOR BLOOD PRESSURE SUPPORT)
80.0000 ug | PREFILLED_SYRINGE | INTRAVENOUS | Status: DC | PRN
  Filled 2012-08-03: qty 2
  Filled 2012-08-03: qty 5

## 2012-08-03 MED ORDER — OXYCODONE-ACETAMINOPHEN 5-325 MG PO TABS: 1.0000 | ORAL_TABLET | ORAL | Status: DC | PRN

## 2012-08-03 MED ORDER — OXYTOCIN BOLUS FROM INFUSION: 500.0000 mL | INTRAVENOUS | Status: DC

## 2012-08-03 MED ORDER — FENTANYL 2.5 MCG/ML BUPIVACAINE 1/10 % EPIDURAL INFUSION (WH - ANES)
14.0000 mL/h | INTRAMUSCULAR | Status: DC | PRN
  Filled 2012-08-03: qty 125

## 2012-08-03 NOTE — Anesthesia Preprocedure Evaluation (Signed)

## 2012-08-03 NOTE — Anesthesia Procedure Notes (Signed)
Epidural Patient location during procedure: OB Start time: 08/03/2012 11:10 AM End time: 08/03/2012 11:14 AM  Staffing Anesthesiologist: Sandrea Hughs Performed by: anesthesiologist   Preanesthetic Checklist Completed: patient identified, surgical consent, pre-op evaluation, timeout performed, IV checked, risks and benefits discussed and monitors and equipment checked  Epidural Patient position: sitting Prep: site prepped and draped and DuraPrep Patient monitoring: continuous pulse ox and blood pressure Approach: midline Injection technique: LOR air  Needle:  Needle type: Tuohy  Needle gauge: 17 G Needle length: 9 cm and 9 Needle insertion depth: 6 cm Catheter type: closed end flexible Catheter size: 19 Gauge Catheter at skin depth: 11 cm Test dose: negative and Other  Assessment Sensory level: T10 Events: blood not aspirated, injection not painful, no injection resistance, negative IV test and no paresthesia  Additional Notes Reason for block:procedure for pain

## 2012-08-03 NOTE — Progress Notes (Signed)
Patient ID: [name], female   DOB: [birth year], 27 y.o.   MRN: [id number] Pt doing well afeb vss FHR reactive 70/2-3/-2 AROM light meconium Pt starting on pitocin, GBS negative

## 2012-08-03 NOTE — Anesthesia Postprocedure Evaluation (Signed)
Anesthesia Post Note  Patient: [name]  Procedure(s) Performed: * No procedures listed *  Anesthesia type: Epidural  Patient location: Mother/Baby  Post pain: Pain level controlled  Post assessment: Post-op Vital signs reviewed  Last Vitals: BP 119/72  Pulse 65  Temp(Src) 36.8 C (Oral)  Resp 20  Ht 5\' 5"  (1.651 m)  Wt 157 lb (71.215 kg)  BMI 26.13 kg/m2  LMP 11/01/2011  Post vital signs: Reviewed  Level of consciousness: awake  Complications: No apparent anesthesia complications

## 2012-08-03 NOTE — Progress Notes (Signed)
Patient ID: [name], female   DOB: [birth year], 27 y.o.   MRN: [id number] Pt feeling pressure, cervix 9+cm/0 station afeb VSS FHR reassurinig, to begin pushing soon

## 2012-08-04 LAB — CBC
Hemoglobin: 6.7 g/dL — CL (ref 12.0–15.0)
MCH: 23.6 pg — ABNORMAL LOW (ref 26.0–34.0)
MCHC: 31.9 g/dL (ref 30.0–36.0)
Platelets: 135 10*3/uL — ABNORMAL LOW (ref 150–400)
RDW: 14.9 % (ref 11.5–15.5)

## 2012-08-04 MED ORDER — PNEUMOCOCCAL VAC POLYVALENT 25 MCG/0.5ML IJ INJ
0.5000 mL | INJECTION | Freq: Once | INTRAMUSCULAR | Status: AC
  Administered 2012-08-04: 0.5 mL via INTRAMUSCULAR
  Filled 2012-08-04: qty 0.5

## 2012-08-04 NOTE — Clinical Social Work Note (Signed)
CSW reviewed MOB's chart and spoke with RN.  MOB currently managing symptoms of depression with Zoloft.  Please reconsult CSW if further needs arise or at MOB's request.    Patient was referred for history of depression/anxiety.  * Referral screened out by Clinical Social Worker because none of the following criteria appear to apply: ~ History of anxiety/depression during this pregnancy, or of post-partum depression. ~ Diagnosis of anxiety and/or depression within last 3 years ~ History of depression due to pregnancy loss/loss of child  OR  * Patient's symptoms currently being treated with medication and/or therapy.  Please contact the Clinical Social Worker if needs arise, or by the patient's request. [phone]  

## 2012-08-04 NOTE — Discharge Summary (Signed)
Obstetric Discharge Summary Reason for Admission: induction of labor Prenatal Procedures: none Intrapartum Procedures: spontaneous vaginal delivery Postpartum Procedures: none Complications-Operative and Postpartum: none Hemoglobin  Date Value Range Status  08/04/2012 6.7* 12.0 - 15.0 g/dL Final     REPEATED TO VERIFY     CRITICAL RESULT CALLED TO, READ BACK BY AND VERIFIED WITH:     CHANARA COREY RN.@0652  ON 08/04/12 BY CALDWELL T     HCT  Date Value Range Status  08/04/2012 21.0* 36.0 - 46.0 % Final    Physical Exam:  General: alert and cooperative Lochia: appropriate Uterine Fundus: firm   Discharge Diagnoses: Term Pregnancy-delivered  Discharge Information: Date: 08/05/2012 Activity: pelvic rest Diet: routine Medications: Ibuprofen Condition: improved Instructions: refer to practice specific booklet Discharge to: home Follow-up Information   Follow up with Oliver Pila, MD. Schedule an appointment as soon as possible for a visit in 6 weeks. (postpartum exam)    Contact information:   [address], [address] [place] [address] [phone]       Newborn Data: Live born female  Birth Weight: 8 lb 4.3 oz (3750 g) APGAR: 6, 8  Home with mother.  Oliver Pila 08/05/2012, 11:05 AM

## 2012-08-04 NOTE — Progress Notes (Signed)
Post Partum Day 1 Subjective: no complaints, up ad lib and tolerating PO.  Pt with anemia, but not big drop from baseline.  No c/o of dizziness or problems ambulating  Objective: Blood pressure 112/74, pulse 86, temperature 98.1 F (36.7 C), temperature source Oral, resp. rate 20, height 5\' 5"  (1.651 m), weight 71.215 kg (157 lb), last menstrual period 11/01/2011, unknown if currently breastfeeding.  Physical Exam:  General: alert and cooperative Lochia: appropriate Uterine Fundus: firm    Recent Labs  08/03/12 0850 08/04/12 0625  HGB 7.9* 6.7*  HCT 23.8* 21.0*    Assessment/Plan: Plan for discharge tomorrow   LOS: 1 day   [name] W 08/04/2012, 8:13 AM

## 2012-08-04 NOTE — Progress Notes (Signed)
CRITICAL VALUE ALERT  Critical value received:  Hgb 6.7  Date of notification:  08/04/2012  Time of notification:  0650  Critical value read back:yes  Nurse who received alert:  C. Denyse Amass, RN  MD notified (1st page):  Dr. Senaida Ores  Time of first page:  [phone]  MD notified (2nd page):  Time of second page:  Responding MD:  Dr. Senaida Ores  Time MD responded:  0700

## 2012-08-05 MED ORDER — OXYCODONE-ACETAMINOPHEN 5-325 MG PO TABS: 1.0000 | ORAL_TABLET | ORAL | Status: DC | PRN

## 2012-08-05 MED ORDER — IBUPROFEN 600 MG PO TABS: 600.0000 mg | ORAL_TABLET | Freq: Four times a day (QID) | ORAL | Status: DC

## 2012-08-05 NOTE — Progress Notes (Signed)
Post Partum Day 2 Subjective: no complaints, up ad lib and tolerating PO  Objective: Blood pressure 125/76, pulse 78, temperature 98.2 F (36.8 C), temperature source Oral, resp. rate 18, height 5\' 5"  (1.651 m), weight 71.215 kg (157 lb), last menstrual period 11/01/2011, SpO2 96.00%, unknown if currently breastfeeding.  Physical Exam:  General: alert and cooperative Lochia: appropriate Uterine Fundus: firm    Recent Labs  08/03/12 0850 08/04/12 0625  HGB 7.9* 6.7*  HCT 23.8* 21.0*    Assessment/Plan: Discharge home To take po iron   LOS: 2 days   [name] 08/05/2012, 11:03 AM

## 2012-08-05 NOTE — Lactation Note (Signed)
This note was copied from the chart of [name]. Lactation Consultation Note  Patient Name: Joy Jaima Janney [id number] Date: 08/05/2012 Reason for consult: Follow-up assessment   Maternal Data    Feeding   LATCH Score/Interventions                      Lactation Tools Discussed/Used     Consult Status Consult Status: Complete  Experienced BF mom had baby latched to breast when I went in. Just finishing feeding. Reports that this baby has been nursing well since birth.  Asking about pump- does not have WIC yet so we can not loan her a pump. Discussed pump rental vs purchase. Going back to work in 6 [employer]. Plans to call [facility] tomorrow to see if they can help her. Can call us later to rent or purchase pump.No further questions at present. To call prn  Pamelia Hoit 08/05/2012, 9:52 AM

## 2012-08-06 NOTE — Progress Notes (Signed)
Post discharge chart review completed.  

## 2012-08-10 ENCOUNTER — Inpatient Hospital Stay (HOSPITAL_COMMUNITY)
Admission: AD | Admit: 2012-08-10 | Discharge: 2012-08-11 | Disposition: A | Discharge status: Home / Self Care | Payer: Medicaid Other | Source: Ambulatory Visit | Attending: Obstetrics and Gynecology | Admitting: Obstetrics and Gynecology

## 2012-08-10 DIAGNOSIS — O864 Pyrexia of unknown origin following delivery: Secondary | ICD-10-CM | POA: Insufficient documentation

## 2012-08-10 DIAGNOSIS — R109 Unspecified abdominal pain: Secondary | ICD-10-CM | POA: Insufficient documentation

## 2012-08-10 LAB — URINE MICROSCOPIC-ADD ON

## 2012-08-10 LAB — COMPREHENSIVE METABOLIC PANEL
AST: 10 U/L (ref 0–37)
Albumin: 2.8 g/dL — ABNORMAL LOW (ref 3.5–5.2)
Chloride: 102 mEq/L (ref 96–112)
Creatinine, Ser: 0.67 mg/dL (ref 0.50–1.10)
Sodium: 135 mEq/L (ref 135–145)
Total Bilirubin: 0.2 mg/dL — ABNORMAL LOW (ref 0.3–1.2)

## 2012-08-10 LAB — URINALYSIS, ROUTINE W REFLEX MICROSCOPIC
Glucose, UA: NEGATIVE mg/dL
Protein, ur: NEGATIVE mg/dL
Specific Gravity, Urine: 1.02 (ref 1.005–1.030)

## 2012-08-10 LAB — CBC
MCV: 76.5 fL — ABNORMAL LOW (ref 78.0–100.0)
Platelets: 263 10*3/uL (ref 150–400)
RDW: 16.8 % — ABNORMAL HIGH (ref 11.5–15.5)
WBC: 5.3 10*3/uL (ref 4.0–10.5)

## 2012-08-10 NOTE — MAU Provider Note (Signed)
History     CSN: [id number]  Arrival date and time: 08/10/12 2210   First Provider Initiated Contact with Patient 08/10/12 2316      Chief Complaint  Patient presents with  . Fever  . Abdominal Pain   HPI  [name] is a 27 y.o. [id number] s/p NSVD on 08/03/12. She had onset of fever of 102 at home today. She has since taken ibuprofen. She has had some cramping and lower abdominal pain. She denies any N/V  Past Medical History  Diagnosis Date  . Depression   . Cystic fibrosis carrier   . Headache(784.0)   . Abnormal Pap smear   . Pilonidal cyst   . HPV (human papilloma virus) anogenital infection     Past Surgical History  Procedure Laterality Date  . No past surgeries      Family History  Problem Relation Age of Onset  . Hypertension Mother   . Asthma Mother   . Cancer Paternal Uncle     stomach  . Cancer Maternal Grandmother     liver  . Diabetes Maternal Grandmother   . Diabetes Maternal Grandfather   . Cancer Paternal Grandfather     lung    History  Substance Use Topics  . Smoking status: Current Every Day Smoker -- 0.25 packs/day    Types: Cigarettes  . Smokeless tobacco: Never Used  . Alcohol Use: No     Comment: History of alcoholism goes to AA    Allergies:  Allergies  Allergen Reactions  . Sulfa Antibiotics Other (See Comments)    Unknown reaction.    Prescriptions prior to admission  Medication Sig Dispense Refill  . ibuprofen (ADVIL,MOTRIN) 600 MG tablet Take 1 tablet (600 mg total) by mouth every 6 (six) hours.  30 tablet  0  . IRON PO Take 1 tablet by mouth daily.       Marland Kitchen oxyCODONE-acetaminophen (PERCOCET/ROXICET) 5-325 MG per tablet Take 1-2 tablets by mouth every 4 (four) hours as needed.  30 tablet  0  . sertraline (ZOLOFT) 25 MG tablet Take 25 mg by mouth at bedtime.         ROS Physical Exam   Blood pressure 138/76, pulse 80, temperature 98.7 F (37.1 C), resp. rate 18, height 5\' 4"  (1.626 m), weight 64.048 kg (141 lb 3.2  oz), last menstrual period 11/01/2011, unknown if currently breastfeeding.  Physical Exam  MAU Course  Procedures  Results for orders placed during the hospital encounter of 08/10/12 (from the past 24 hour(s))  URINALYSIS, ROUTINE W REFLEX MICROSCOPIC     Status: Abnormal   Collection Time    08/10/12 10:20 PM      Result Value Range   Color, Urine YELLOW  YELLOW   APPearance CLEAR  CLEAR   Specific Gravity, Urine 1.020  1.005 - 1.030   pH 7.0  5.0 - 8.0   Glucose, UA NEGATIVE  NEGATIVE mg/dL   Hgb urine dipstick LARGE (*) NEGATIVE   Bilirubin Urine NEGATIVE  NEGATIVE   Ketones, ur NEGATIVE  NEGATIVE mg/dL   Protein, ur NEGATIVE  NEGATIVE mg/dL   Urobilinogen, UA 1.0  0.0 - 1.0 mg/dL   Nitrite NEGATIVE  NEGATIVE   Leukocytes, UA SMALL (*) NEGATIVE  URINE MICROSCOPIC-ADD ON     Status: Abnormal   Collection Time    08/10/12 10:20 PM      Result Value Range   Squamous Epithelial / LPF FEW (*) RARE   WBC, UA 3-6  <3  WBC/hpf   RBC / HPF 11-20  <3 RBC/hpf   Bacteria, UA FEW (*) RARE  CBC     Status: Abnormal   Collection Time    08/10/12 10:25 PM      Result Value Range   WBC 5.3  4.0 - 10.5 K/uL   RBC 3.53 (*) 3.87 - 5.11 MIL/uL   Hemoglobin 8.5 (*) 12.0 - 15.0 g/dL   HCT 95.6 (*) 21.3 - 08.6 %   MCV 76.5 (*) 78.0 - 100.0 fL   MCH 24.1 (*) 26.0 - 34.0 pg   MCHC 31.5  30.0 - 36.0 g/dL   RDW 57.8 (*) 46.9 - 62.9 %   Platelets 263  150 - 400 K/uL  COMPREHENSIVE METABOLIC PANEL     Status: Abnormal   Collection Time    08/10/12 10:25 PM      Result Value Range   Sodium 135  135 - 145 mEq/L   Potassium 3.9  3.5 - 5.1 mEq/L   Chloride 102  96 - 112 mEq/L   CO2 25  19 - 32 mEq/L   Glucose, Bld 100 (*) 70 - 99 mg/dL   BUN 10  6 - 23 mg/dL   Creatinine, Ser 5.28  0.50 - 1.10 mg/dL   Calcium 8.5  8.4 - 41.3 mg/dL   Total Protein 6.3  6.0 - 8.3 g/dL   Albumin 2.8 (*) 3.5 - 5.2 g/dL   AST 10  0 - 37 U/L   ALT 9  0 - 35 U/L   Alkaline Phosphatase 114  39 - 117 U/L   Total  Bilirubin 0.2 (*) 0.3 - 1.2 mg/dL   GFR calc non Af Amer >90  >90 mL/min   GFR calc Af Amer >90  >90 mL/min    2354: Spoke with Dr. Ellyn Hack, patient is ok to be sent home at this time. Have her continue to monitor symptoms, and FU with the office if they continue.  Assessment and Plan   1. Postpartum fever    With no identifiable cause at this time Reviewed precautions FU with the office as planned  Tawnya Crook 08/10/2012, 11:24 PM

## 2012-08-10 NOTE — Discharge Instructions (Signed)

## 2012-08-10 NOTE — MAU Note (Signed)
I delivered vaginally last Friday. About 1800 I had chills and temp 102.Took Ibuprofen and hydrocodone. Temp went to 100.5 and then 99.6. Having pain in lower abd also.

## 2012-08-12 LAB — URINE CULTURE

## 2013-11-18 ENCOUNTER — Encounter (HOSPITAL_COMMUNITY): Payer: Self-pay

## 2015-03-03 ENCOUNTER — Encounter (HOSPITAL_COMMUNITY): Payer: Self-pay

## 2015-03-03 ENCOUNTER — Emergency Department (HOSPITAL_COMMUNITY)
Admission: EM | Admit: 2015-03-03 | Discharge: 2015-03-03 | Disposition: A | Discharge status: Home / Self Care | Payer: Medicaid Other | Attending: Emergency Medicine | Admitting: Emergency Medicine

## 2015-03-03 DIAGNOSIS — Z872 Personal history of diseases of the skin and subcutaneous tissue: Secondary | ICD-10-CM | POA: Insufficient documentation

## 2015-03-03 DIAGNOSIS — F329 Major depressive disorder, single episode, unspecified: Secondary | ICD-10-CM | POA: Insufficient documentation

## 2015-03-03 DIAGNOSIS — Z791 Long term (current) use of non-steroidal anti-inflammatories (NSAID): Secondary | ICD-10-CM | POA: Insufficient documentation

## 2015-03-03 DIAGNOSIS — F17210 Nicotine dependence, cigarettes, uncomplicated: Secondary | ICD-10-CM | POA: Diagnosis not present

## 2015-03-03 DIAGNOSIS — K0889 Other specified disorders of teeth and supporting structures: Secondary | ICD-10-CM | POA: Diagnosis present

## 2015-03-03 DIAGNOSIS — Z8619 Personal history of other infectious and parasitic diseases: Secondary | ICD-10-CM | POA: Insufficient documentation

## 2015-03-03 DIAGNOSIS — Z141 Cystic fibrosis carrier: Secondary | ICD-10-CM | POA: Diagnosis not present

## 2015-03-03 DIAGNOSIS — Z79899 Other long term (current) drug therapy: Secondary | ICD-10-CM | POA: Insufficient documentation

## 2015-03-03 DIAGNOSIS — K029 Dental caries, unspecified: Secondary | ICD-10-CM | POA: Diagnosis not present

## 2015-03-03 MED ORDER — AMOXICILLIN 500 MG PO CAPS: 1000.0000 mg | ORAL_CAPSULE | Freq: Two times a day (BID) | ORAL | Status: DC

## 2015-03-03 MED ORDER — BUPIVACAINE HCL 0.5 % IJ SOLN: 50.0000 mL | Freq: Once | INTRAMUSCULAR | Status: DC

## 2015-03-03 MED ORDER — HYDROCODONE-ACETAMINOPHEN 5-325 MG PO TABS: ORAL_TABLET | ORAL | Status: DC

## 2015-03-03 MED ORDER — BUPIVACAINE-EPINEPHRINE (PF) 0.5% -1:200000 IJ SOLN
1.8000 mL | Freq: Once | INTRAMUSCULAR | Status: AC
  Administered 2015-03-03: 1.8 mL

## 2015-03-03 MED ORDER — BUPIVACAINE-EPINEPHRINE 0.5% -1:200000 IJ SOLN: 10.0000 mL | Freq: Once | INTRAMUSCULAR | Status: DC

## 2015-03-03 NOTE — ED Provider Notes (Signed)
CSN: [id number]     Arrival date & time 03/03/15  2024 History  By signing my name below, I, [facility], attest that this documentation has been prepared under the direction and in the presence of [employer], PA-C. Electronically Signed: Randell Patient, ED Scribe. 03/03/2015. 10:09 PM.   Chief Complaint  Patient presents with  . Dental Pain   The history is provided by the patient. No language interpreter was used.   HPI Comments: [name] is a 30 y.o. female who presents to the Emergency Department complaining of constant, moderate, gradually worsening, right lower dental pain that began 2 days ago, worse yesterday. Patient reports that she woke with two broken teeth and that she found pieces of her teeth on her tongue 2 days ago. She endorses throbbing pain in her jaw that radiates to her bilateral temples. Per patient, she has not been seen by a dentist but states that her dental insurance begins on March 1, two weeks from now. Allergic to sulfa antibiotics. She denies any other symptoms currently.  Past Medical History  Diagnosis Date  . Depression   . Cystic fibrosis carrier   . Headache(784.0)   . Abnormal Pap smear   . Pilonidal cyst   . HPV (human papilloma virus) anogenital infection    Past Surgical History  Procedure Laterality Date  . No past surgeries     Family History  Problem Relation Age of Onset  . Hypertension Mother   . Asthma Mother   . Cancer Paternal Uncle     stomach  . Cancer Maternal Grandmother     liver  . Diabetes Maternal Grandmother   . Diabetes Maternal Grandfather   . Cancer Paternal Grandfather     lung   Social History  Substance Use Topics  . Smoking status: Current Every Day Smoker -- 0.25 packs/day    Types: Cigarettes  . Smokeless tobacco: Never Used  . Alcohol Use: No     Comment: History of alcoholism goes to AA   OB History    Gravida Para Term Preterm AB TAB SAB Ectopic Multiple Living   3 2 2  0 1 1 0  0 0 2     Review of Systems A complete 10 system review of systems was obtained and all systems are negative except as noted in the HPI and PMH.    Allergies  Sulfa antibiotics  Home Medications   Prior to Admission medications   Medication Sig Start Date End Date Taking? Authorizing Provider  amoxicillin (AMOXIL) 500 MG capsule Take 2 capsules (1,000 mg total) by mouth 2 (two) times daily. 03/03/15   Burlene Montecalvo, PA-C  HYDROcodone-acetaminophen (NORCO/VICODIN) 5-325 MG tablet Take 1-2 tablets by mouth every 6 hours as needed for pain and/or cough. 03/03/15   Amaree Loisel, PA-C  ibuprofen (ADVIL,MOTRIN) 600 MG tablet Take 1 tablet (600 mg total) by mouth every 6 (six) hours. 08/05/12   Huel Cote, MD  IRON PO Take 1 tablet by mouth daily.     Historical Provider, MD  oxyCODONE-acetaminophen (PERCOCET/ROXICET) 5-325 MG per tablet Take 1-2 tablets by mouth every 4 (four) hours as needed. 08/05/12   Huel Cote, MD  sertraline (ZOLOFT) 25 MG tablet Take 25 mg by mouth at bedtime.     Historical Provider, MD   BP 121/75 mmHg  Pulse 95  Temp(Src) 98.2 F (36.8 C)  Resp 16  SpO2 100% Physical Exam  Constitutional: She is oriented to person, place, and time. She appears well-developed  and well-nourished. No distress.  HENT:  Head: Normocephalic.  Mouth/Throat: Uvula is midline and oropharynx is clear and moist. No trismus in the jaw. No uvula swelling. No oropharyngeal exudate, posterior oropharyngeal edema, posterior oropharyngeal erythema or tonsillar abscesses.  Generally poor dentition, Teeth 25 and 26 with deep dental carries and fractures. no gingival swelling, erythema or tenderness to palpation. Patient is handling their secretions. There is no tenderness to palpation or firmness underneath tongue bilaterally. No trismus.    Eyes: Conjunctivae and EOM are normal.  Cardiovascular: Normal rate.   Pulmonary/Chest: Effort normal. No stridor.  Musculoskeletal: Normal  range of motion.  Lymphadenopathy:    She has no cervical adenopathy.  Neurological: She is alert and oriented to person, place, and time.  Psychiatric: She has a normal mood and affect.  Nursing note and vitals reviewed.   ED Course  .Nerve Block Date/Time: 03/03/2015 10:03 PM Performed by: Wynetta Emery Authorized by: Wynetta Emery Consent: Verbal consent obtained. Consent given by: patient Patient identity confirmed: verbally with patient Indications: pain relief Body area: face/mouth Nerve: inferior alveolar Laterality: right Patient sedated: no Preparation: Patient was prepped and draped in the usual sterile fashion. Patient position: left lateral decubitus Needle gauge: 27 G Location technique: anatomical landmarks Local anesthetic: bupivacaine 0.5% with epinephrine Anesthetic total: 1.8 ml Outcome: pain improved Patient tolerance: Patient tolerated the procedure well with no immediate complications  Dental Date/Time: 03/03/2015 10:05 PM Performed by: Wynetta Emery Authorized by: Wynetta Emery Consent: Verbal consent obtained. Risks and benefits: risks, benefits and alternatives were discussed Consent given by: patient Patient identity confirmed: verbally with patient Patient sedated: no Patient tolerance: Patient tolerated the procedure well with no immediate complications Comments: Dental cement applied to teeth 25 and 26     DIAGNOSTIC STUDIES: Oxygen Saturation is 100% on RA, normal by my interpretation.    COORDINATION OF CARE: 9:27 PM Will order dental block and apply dental cement. Discussed treatment plan with pt at bedside and pt agreed to plan.  Labs Review Labs Reviewed - No data to display  Imaging Review No results found. I have personally reviewed and evaluated these images and lab results as part of my medical decision-making.   EKG Interpretation None      MDM   Final diagnoses:  Pain due to dental caries    Filed  Vitals:   03/03/15 2207  BP: 121/75  Pulse: 95  Temp: 98.2 F (36.8 C)  Resp: 16  SpO2: 100%    Medications  bupivacaine-epinephrine (MARCAINE W/ EPI) 0.5% -1:200000 injection 1.8 mL (not administered)    Chena Chohan is 30 y.o. female presenting with severe dental pain after the front tooth fractured spontaneously secondary to severe dental caries. No signs of secondary infection. Patient is given a nerve block noncompliant dental cement to try to protect the nerve root. Patient will be started on antibiotics and given dental referral.  Evaluation does not show pathology that would require ongoing emergent intervention or inpatient treatment. Pt is hemodynamically stable and mentating appropriately. Discussed findings and plan with patient/guardian, who agrees with care plan. All questions answered. Return precautions discussed and outpatient follow up given.   New Prescriptions   AMOXICILLIN (AMOXIL) 500 MG CAPSULE    Take 2 capsules (1,000 mg total) by mouth 2 (two) times daily.   HYDROCODONE-ACETAMINOPHEN (NORCO/VICODIN) 5-325 MG TABLET    Take 1-2 tablets by mouth every 6 hours as needed for pain and/or cough.    I personally performed the services described  in this documentation, which was scribed in my presence. The recorded information has been reviewed and is accurate.   Wynetta Emery, PA-C 03/03/15 2209  Loren Racer, MD 03/04/15 2112

## 2015-03-03 NOTE — ED Notes (Signed)
Pt complaining of dental pain. States she woke up and her teeth were falling apart. Obviously poor dental hygiene. PA at bedside for dental block.

## 2015-03-03 NOTE — Discharge Instructions (Signed)
Take vicodin for breakthrough pain, do not drink alcohol, drive, care for children or do other critical tasks while taking vicodin. ° °Return to the emergency room for fever, change in vision, redness to the face that rapidly spreads towards the eye, nausea or vomiting, difficulty swallowing or shortness of breath. ° °Take your antibiotics as directed and to the end of the course.  ° °Followup with a dentist is very important for ongoing evaluation and management of recurrent dental pain. Return to emergency department for emergent changing or worsening symptoms." ° °Low-cost dental clinic: °**David  Civils  at [phone]**  ° °Community Resource Guide Dental °The United Way’s “211” is a great source of information about community services available.  Access by dialing 2-1-1 from anywhere in [place], or by website -  www.nc211.org.  ° °Other Local Resources (Updated 01/2015) ° °Dental  Care °  °Services ° °  °Phone Number and Address  °Cost  °[facility] For children 0 - 21 years of age:  °• Cleaning °• Tooth brushing/flossing instruction °• Sealants, fillings, crowns °• Extractions °• Emergency treatment  [phone] °[address] °[place], [place] [address] Charges based on family income.  Medicaid and some insurance plans accepted.   °  °[facility] [place] • Cleaning °• Sealants, fillings, crowns °• Extractions °• Emergency treatment [phone] °[address] °[place], [place] ° Pregnant women 18 years of age or older with a Medicaid card  °[facility] [place] • Cleaning °• Sealants, fillings, crowns °• Extractions °• Emergency treatment [phone] °[address] °[place], [place] Pregnant women 18 years of age or older with a Medicaid card  °[facility] For children 0 - 21 years of age:  °• Cleaning °• Tooth brushing/flossing  instruction °• Sealants, fillings, crowns °• Extractions °• Emergency treatment °Limited orthodontic services for patients with Medicaid [phone] °[address] °[place], [place] [address] Medicaid and [place] Health Choice cover for children up to age 21 and pregnant women.  Parents of children up to age 21 without Medicaid pay a reduced fee at time of service.  °[facility] [place] For children 0 - 21 years of age:  °• Cleaning °• Tooth brushing/flossing instruction °• Sealants, fillings, crowns °• Extractions °• Emergency treatment °Limited orthodontic services for patients with Medicaid [phone] °[address] °[place], [place].  Medicaid and [place] Health Choice cover for children up to age 21 and pregnant women.  Parents of children up to age 21 without Medicaid pay a reduced fee.  °[facility] • Cleaning °• Sealants, fillings, crowns °• Extractions ° °Hours: Tuesdays and Thursdays, 4:15 - 8 pm [phone] °[address] °[place], [place] [address] Services free of charge to [place] residents ages 18-64 who do not have health insurance, Medicare, Medicaid, or VA benefits and fall within federal poverty guidelines  °[facility] ° ° ° Provides dental care in addition to primary medical care, nutritional counseling, and pharmacy: °• Cleaning °• Sealants, fillings, crowns °• Extractions ° ° ° ° ° ° ° ° ° ° ° ° ° ° ° ° ° [phone] °[facility], [address] °[place], [place] ° °[phone] °[facility], [address] [place], [place] ° °[phone] °[facility] °[place], [place] ° °[phone] °[facility], [address] °[place], [place] ° °[phone] °[facility] °[address] °[place], [place] Accepts Medicaid, Medicare, most insurance.  Also provides services available to all  with fees adjusted based on ability to pay.    °[facility] • Cleaning °• Tooth brushing/flossing instruction °• Sealants, fillings, crowns °• Extractions °• Emergency treatment °Hours: Tuesdays, Thursdays, and Fridays from 8 am to 5 pm by appointment only. [phone] °[address] °[place], [place] [address] [place] residents with Medicaid (depending on eligibility) and children with [place] Health Choice - call for more information.  °[facility] • Extractions only ° °Hours: 2nd and 4th Thursday of each month from 6:30 am - 9 am.   [phone] ext. [phone] °[address] °[place], [place] [address] Ages 18 and older only.  Patients are seen on a first come, first served basis.  °[facility] • Cleanings °• Fillings °• Extractions °• Orthodontics °• Endodontics °• Implants/Crowns/Bridges °• Complete and partial dentures [phone] °[place], [place] Patients must complete an application for services.  There is often a waiting list.   ° °

## 2015-03-25 ENCOUNTER — Encounter (HOSPITAL_COMMUNITY): Payer: Self-pay | Admitting: Vascular Surgery

## 2015-03-25 ENCOUNTER — Emergency Department (HOSPITAL_COMMUNITY)
Admission: EM | Admit: 2015-03-25 | Discharge: 2015-03-25 | Disposition: A | Discharge status: Home / Self Care | Payer: Medicaid Other | Attending: Emergency Medicine | Admitting: Emergency Medicine

## 2015-03-25 DIAGNOSIS — Z8619 Personal history of other infectious and parasitic diseases: Secondary | ICD-10-CM | POA: Insufficient documentation

## 2015-03-25 DIAGNOSIS — Z79899 Other long term (current) drug therapy: Secondary | ICD-10-CM | POA: Insufficient documentation

## 2015-03-25 DIAGNOSIS — Z760 Encounter for issue of repeat prescription: Secondary | ICD-10-CM | POA: Diagnosis present

## 2015-03-25 DIAGNOSIS — F329 Major depressive disorder, single episode, unspecified: Secondary | ICD-10-CM | POA: Diagnosis not present

## 2015-03-25 DIAGNOSIS — Z872 Personal history of diseases of the skin and subcutaneous tissue: Secondary | ICD-10-CM | POA: Diagnosis not present

## 2015-03-25 DIAGNOSIS — F17210 Nicotine dependence, cigarettes, uncomplicated: Secondary | ICD-10-CM | POA: Diagnosis not present

## 2015-03-25 MED ORDER — AMPHETAMINE-DEXTROAMPHETAMINE 20 MG PO TABS: 20.0000 mg | ORAL_TABLET | Freq: Every day | ORAL | Status: DC

## 2015-03-25 MED ORDER — AMPHETAMINE-DEXTROAMPHET ER 25 MG PO CP24: 25.0000 mg | ORAL_CAPSULE | ORAL | Status: DC

## 2015-03-25 NOTE — ED Provider Notes (Signed)
CSN: [id number]     Arrival date & time 03/25/15  1231 History  By signing my name below, I, Emmanuella Mensah, attest that this documentation has been prepared under the direction and in the presence of Danelle Berry, PA-C. Electronically Signed: Angelene Giovanni, ED Scribe. 03/25/2015. 2:05 PM.    Chief Complaint  Patient presents with  . Medication Refill   The history is provided by the patient. No language interpreter was used.    HPI Comments: [name] is a 30 y.o. female who presents to the Emergency Department requesting a medication refill for adderall. She states that she was dismissed from her PCP due to an overdue bill. She explains that although she paid the bill, her PCP would not refill her medication. Her last dose was today. No medical complaints at this time. No n/v. PT denies medication side effects.  She denies weightloss, dry mouth, loss of appetite, palpitations, chest pain.    Past Medical History  Diagnosis Date  . Depression   . Cystic fibrosis carrier   . Headache(784.0)   . Abnormal Pap smear   . Pilonidal cyst   . HPV (human papilloma virus) anogenital infection    Past Surgical History  Procedure Laterality Date  . No past surgeries     Family History  Problem Relation Age of Onset  . Hypertension Mother   . Asthma Mother   . Cancer Paternal Uncle     stomach  . Cancer Maternal Grandmother     liver  . Diabetes Maternal Grandmother   . Diabetes Maternal Grandfather   . Cancer Paternal Grandfather     lung   Social History  Substance Use Topics  . Smoking status: Current Every Day Smoker -- 0.25 packs/day    Types: Cigarettes  . Smokeless tobacco: Never Used  . Alcohol Use: No     Comment: History of alcoholism goes to AA   OB History    Gravida Para Term Preterm AB TAB SAB Ectopic Multiple Living   3 2 2  0 1 1 0 0 0 2     Review of Systems  Gastrointestinal: Negative for nausea and vomiting.  All other systems reviewed and are  negative.     Allergies  Sulfa antibiotics  Home Medications   Prior to Admission medications   Medication Sig Start Date End Date Taking? Authorizing Provider  amoxicillin (AMOXIL) 500 MG capsule Take 2 capsules (1,000 mg total) by mouth 2 (two) times daily. 03/03/15   Nicole Pisciotta, PA-C  amphetamine-dextroamphetamine (ADDERALL XR) 25 MG 24 hr capsule Take 1 capsule by mouth every morning. 03/25/15 04/01/15  Danelle Berry, PA-C  amphetamine-dextroamphetamine (ADDERALL) 20 MG tablet Take 1 tablet (20 mg total) by mouth daily. 03/25/15 04/01/15  Danelle Berry, PA-C  HYDROcodone-acetaminophen (NORCO/VICODIN) 5-325 MG tablet Take 1-2 tablets by mouth every 6 hours as needed for pain and/or cough. 03/03/15   Nicole Pisciotta, PA-C  ibuprofen (ADVIL,MOTRIN) 600 MG tablet Take 1 tablet (600 mg total) by mouth every 6 (six) hours. 08/05/12   Huel Cote, MD  IRON PO Take 1 tablet by mouth daily.     Historical Provider, MD  oxyCODONE-acetaminophen (PERCOCET/ROXICET) 5-325 MG per tablet Take 1-2 tablets by mouth every 4 (four) hours as needed. 08/05/12   Huel Cote, MD  sertraline (ZOLOFT) 25 MG tablet Take 25 mg by mouth at bedtime.     Historical Provider, MD   BP 124/77 mmHg  Pulse 90  Temp(Src) 97.7 F (36.5 C) (Oral)  Resp 18  SpO2 100% Physical Exam  Constitutional: She is oriented to person, place, and time. She appears well-developed and well-nourished. No distress.  HENT:  Head: Normocephalic and atraumatic.  Right Ear: External ear normal.  Left Ear: External ear normal.  Nose: Nose normal.  Mouth/Throat: Oropharynx is clear and moist. No oropharyngeal exudate.  Eyes: Conjunctivae and EOM are normal. Pupils are equal, round, and reactive to light. Right eye exhibits no discharge. Left eye exhibits no discharge. No scleral icterus.  Neck: Normal range of motion. Neck supple. No JVD present. No tracheal deviation present.  Cardiovascular: Normal rate, regular rhythm, normal  heart sounds and intact distal pulses.  Exam reveals no gallop and no friction rub.   No murmur heard. Pulmonary/Chest: Effort normal and breath sounds normal. No stridor. No respiratory distress. She has no wheezes. She has no rales.  Abdominal: Soft. Bowel sounds are normal. She exhibits no distension. There is no tenderness.  Musculoskeletal: Normal range of motion. She exhibits no edema.  Lymphadenopathy:    She has no cervical adenopathy.  Neurological: She is alert and oriented to person, place, and time. She exhibits normal muscle tone. Coordination normal.  Skin: Skin is warm and dry. No rash noted. She is not diaphoretic. No erythema. No pallor.  Psychiatric: She has a normal mood and affect. Her speech is normal and behavior is normal. Judgment and thought content normal. Cognition and memory are normal.  Good eye contact, intermittently tearful  Nursing note and vitals reviewed.   ED Course  Procedures (including critical care time) DIAGNOSTIC STUDIES: Oxygen Saturation is 100% on RA, normal by my interpretation.    COORDINATION OF CARE: 2:04 PM- Pt advised of plan for treatment and pt agrees. Explained that it is against policy to refill daily controlled substances. Will provide resources for PCP to be able to receive her medication refill.   MDM   Pt requesting medication refill for ADD.  She is tearful in the exam room, explaining that she was discharged from her PCP for old unpaid bill that she was unaware of.  She was looked up in the [place] controlled substance, she has consistent med doses and dates monthly.  One week of her meds were provided for her. PT denies medication side effects.  She denies weightloss, dry mouth, loss of appetite, palpitations, chest pain. Physical exam and vitals normal. She was given info to help her establish new PCP and she was encouraged to contact the mental health services on her medicaid card.    Final diagnoses:  Encounter for medication  refill   I personally performed the services described in this documentation, which was scribed in my presence. The recorded information has been reviewed and is accurate.     Danelle Berry, PA-C 03/27/15 1641  Alvira Monday, MD 03/27/15 [phone]

## 2015-03-25 NOTE — ED Notes (Signed)
Pt reports to the ED for eval of medication refill. She reports she was dismissed from her PCP because she had an overdue bill. Pt took her last dose of adderrall today and is requesting a refill. Pt A&Ox4, resp e/u, and skin warm and dry.

## 2015-03-25 NOTE — ED Notes (Signed)
Pt ambulates independently and with steady gait at time of discharge. Discharge instructions and follow up information reviewed with patient. No other questions or concerns voiced at this time.  

## 2015-03-25 NOTE — Discharge Instructions (Signed)
Medicine Refill at the Emergency Department  We have refilled your medicine today, but it is best for you to get refills through your primary health care provider's office. In the future, please plan ahead so you do not need to get refills from the emergency department.  If the medicine we refilled was a maintenance medicine, you may have received only enough to get you by until you are able to see your regular health care provider.     This information is not intended to replace advice given to you by your health care provider. Make sure you discuss any questions you have with your health care provider.     Document Released: 04/22/2003 Document Revised: 01/24/2014 Document Reviewed: 04/12/2013  Elsevier Interactive Patient Education 2016 Elsevier Inc.

## 2015-04-06 ENCOUNTER — Encounter (HOSPITAL_COMMUNITY): Payer: Self-pay | Admitting: Emergency Medicine

## 2015-04-06 ENCOUNTER — Emergency Department (HOSPITAL_COMMUNITY): Payer: Medicaid Other

## 2015-04-06 ENCOUNTER — Inpatient Hospital Stay (HOSPITAL_COMMUNITY)
Admission: EM | Admit: 2015-04-06 | Discharge: 2015-04-08 | DRG: 312 | Disposition: A | Discharge status: Home / Self Care | Payer: Medicaid Other | Attending: Internal Medicine | Admitting: Internal Medicine

## 2015-04-06 DIAGNOSIS — F329 Major depressive disorder, single episode, unspecified: Secondary | ICD-10-CM | POA: Diagnosis present

## 2015-04-06 DIAGNOSIS — G9340 Encephalopathy, unspecified: Secondary | ICD-10-CM

## 2015-04-06 DIAGNOSIS — K0889 Other specified disorders of teeth and supporting structures: Secondary | ICD-10-CM | POA: Diagnosis present

## 2015-04-06 DIAGNOSIS — Z79899 Other long term (current) drug therapy: Secondary | ICD-10-CM

## 2015-04-06 DIAGNOSIS — E860 Dehydration: Secondary | ICD-10-CM | POA: Diagnosis present

## 2015-04-06 DIAGNOSIS — Z7982 Long term (current) use of aspirin: Secondary | ICD-10-CM

## 2015-04-06 DIAGNOSIS — E43 Unspecified severe protein-calorie malnutrition: Secondary | ICD-10-CM | POA: Diagnosis present

## 2015-04-06 DIAGNOSIS — F419 Anxiety disorder, unspecified: Secondary | ICD-10-CM | POA: Diagnosis not present

## 2015-04-06 DIAGNOSIS — F909 Attention-deficit hyperactivity disorder, unspecified type: Secondary | ICD-10-CM | POA: Diagnosis present

## 2015-04-06 DIAGNOSIS — E7220 Disorder of urea cycle metabolism, unspecified: Secondary | ICD-10-CM | POA: Diagnosis present

## 2015-04-06 DIAGNOSIS — Z791 Long term (current) use of non-steroidal anti-inflammatories (NSAID): Secondary | ICD-10-CM

## 2015-04-06 DIAGNOSIS — Z882 Allergy status to sulfonamides status: Secondary | ICD-10-CM | POA: Diagnosis not present

## 2015-04-06 DIAGNOSIS — F1910 Other psychoactive substance abuse, uncomplicated: Secondary | ICD-10-CM | POA: Diagnosis not present

## 2015-04-06 DIAGNOSIS — F17200 Nicotine dependence, unspecified, uncomplicated: Secondary | ICD-10-CM | POA: Diagnosis present

## 2015-04-06 DIAGNOSIS — R4182 Altered mental status, unspecified: Secondary | ICD-10-CM | POA: Diagnosis present

## 2015-04-06 DIAGNOSIS — F1210 Cannabis abuse, uncomplicated: Secondary | ICD-10-CM | POA: Diagnosis present

## 2015-04-06 DIAGNOSIS — R55 Syncope and collapse: Secondary | ICD-10-CM | POA: Diagnosis present

## 2015-04-06 DIAGNOSIS — F1110 Opioid abuse, uncomplicated: Secondary | ICD-10-CM | POA: Diagnosis present

## 2015-04-06 DIAGNOSIS — F4325 Adjustment disorder with mixed disturbance of emotions and conduct: Secondary | ICD-10-CM | POA: Diagnosis not present

## 2015-04-06 DIAGNOSIS — F1310 Sedative, hypnotic or anxiolytic abuse, uncomplicated: Secondary | ICD-10-CM | POA: Diagnosis present

## 2015-04-06 DIAGNOSIS — Z681 Body mass index (BMI) 19 or less, adult: Secondary | ICD-10-CM

## 2015-04-06 DIAGNOSIS — F900 Attention-deficit hyperactivity disorder, predominantly inattentive type: Secondary | ICD-10-CM | POA: Diagnosis not present

## 2015-04-06 HISTORY — DX: Alcohol abuse, uncomplicated: F10.10

## 2015-04-06 LAB — CBC WITH DIFFERENTIAL/PLATELET
BASOS ABS: 0 10*3/uL (ref 0.0–0.1)
BASOS PCT: 1 %
Eosinophils Absolute: 0.1 10*3/uL (ref 0.0–0.7)
Eosinophils Relative: 3 %
HCT: 37.5 % (ref 36.0–46.0)
HEMOGLOBIN: 12.8 g/dL (ref 12.0–15.0)
LYMPHS PCT: 36 %
Lymphs Abs: 1.7 10*3/uL (ref 0.7–4.0)
MCH: 30.4 pg (ref 26.0–34.0)
MCHC: 34.1 g/dL (ref 30.0–36.0)
MCV: 89.1 fL (ref 78.0–100.0)
MONO ABS: 0.4 10*3/uL (ref 0.1–1.0)
MONOS PCT: 8 %
NEUTROS ABS: 2.4 10*3/uL (ref 1.7–7.7)
NEUTROS PCT: 52 %
Platelets: 204 10*3/uL (ref 150–400)
RBC: 4.21 MIL/uL (ref 3.87–5.11)
RDW: 12.7 % (ref 11.5–15.5)
WBC: 4.7 10*3/uL (ref 4.0–10.5)

## 2015-04-06 LAB — URINALYSIS, ROUTINE W REFLEX MICROSCOPIC
Bilirubin Urine: NEGATIVE
GLUCOSE, UA: NEGATIVE mg/dL
Ketones, ur: NEGATIVE mg/dL
LEUKOCYTES UA: NEGATIVE
Nitrite: NEGATIVE
PROTEIN: NEGATIVE mg/dL
SPECIFIC GRAVITY, URINE: 1.016 (ref 1.005–1.030)
pH: 5.5 (ref 5.0–8.0)

## 2015-04-06 LAB — COMPREHENSIVE METABOLIC PANEL
ALBUMIN: 4.2 g/dL (ref 3.5–5.0)
ALT: 11 U/L — AB (ref 14–54)
AST: 14 U/L — AB (ref 15–41)
Alkaline Phosphatase: 49 U/L (ref 38–126)
Anion gap: 10 (ref 5–15)
BUN: 11 mg/dL (ref 6–20)
CHLORIDE: 105 mmol/L (ref 101–111)
CO2: 25 mmol/L (ref 22–32)
CREATININE: 0.65 mg/dL (ref 0.44–1.00)
Calcium: 9 mg/dL (ref 8.9–10.3)
GFR calc Af Amer: >60 mL/min (ref >60)
GFR calc non Af Amer: >60 mL/min (ref >60)
Glucose, Bld: 96 mg/dL (ref 65–99)
POTASSIUM: 3.7 mmol/L (ref 3.5–5.1)
SODIUM: 140 mmol/L (ref 135–145)
Total Bilirubin: 0.8 mg/dL (ref 0.3–1.2)
Total Protein: 6.9 g/dL (ref 6.5–8.1)

## 2015-04-06 LAB — PROTIME-INR
INR: 1.12 (ref 0.00–1.49)
Prothrombin Time: 14.6 seconds (ref 11.6–15.2)

## 2015-04-06 LAB — URINE MICROSCOPIC-ADD ON

## 2015-04-06 LAB — RAPID URINE DRUG SCREEN, HOSP PERFORMED
Amphetamines: POSITIVE — AB
BARBITURATES: NOT DETECTED
BENZODIAZEPINES: POSITIVE — AB
Cocaine: NOT DETECTED
Opiates: POSITIVE — AB
TETRAHYDROCANNABINOL: POSITIVE — AB

## 2015-04-06 LAB — ACETAMINOPHEN LEVEL: Acetaminophen (Tylenol), Serum: <10 ug/mL — ABNORMAL LOW (ref 10–30)

## 2015-04-06 LAB — MAGNESIUM: Magnesium: 2 mg/dL (ref 1.7–2.4)

## 2015-04-06 LAB — CBG MONITORING, ED: GLUCOSE-CAPILLARY: 97 mg/dL (ref 65–99)

## 2015-04-06 LAB — SALICYLATE LEVEL

## 2015-04-06 LAB — AMMONIA: Ammonia: 36 umol/L — ABNORMAL HIGH (ref 9–35)

## 2015-04-06 LAB — PREGNANCY, URINE: PREG TEST UR: NEGATIVE

## 2015-04-06 LAB — ETHANOL

## 2015-04-06 MED ORDER — NALOXONE HCL 0.4 MG/ML IJ SOLN
0.2000 mg | Freq: Once | INTRAMUSCULAR | Status: AC
  Administered 2015-04-06: 0.2 mg via INTRAVENOUS
  Filled 2015-04-06: qty 1

## 2015-04-06 MED ORDER — POTASSIUM CHLORIDE IN NACL 20-0.9 MEQ/L-% IV SOLN
INTRAVENOUS | Status: DC
  Administered 2015-04-06 – 2015-04-08 (×4): via INTRAVENOUS
  Filled 2015-04-06 (×6): qty 1000

## 2015-04-06 MED ORDER — NALOXONE HCL 2 MG/2ML IJ SOSY
2.0000 mg | PREFILLED_SYRINGE | Freq: Once | INTRAMUSCULAR | Status: AC
  Administered 2015-04-06: 2 mg via INTRAVENOUS
  Filled 2015-04-06: qty 2

## 2015-04-06 MED ORDER — AMPHETAMINE-DEXTROAMPHET ER 10 MG PO CP24
30.0000 mg | ORAL_CAPSULE | Freq: Every day | ORAL | Status: DC
  Administered 2015-04-07 – 2015-04-08 (×2): 30 mg via ORAL
  Filled 2015-04-06 (×2): qty 3

## 2015-04-06 MED ORDER — PANTOPRAZOLE SODIUM 40 MG PO TBEC
40.0000 mg | DELAYED_RELEASE_TABLET | Freq: Every day | ORAL | Status: DC
  Administered 2015-04-06 – 2015-04-08 (×3): 40 mg via ORAL
  Filled 2015-04-06 (×4): qty 1

## 2015-04-06 MED ORDER — ONDANSETRON HCL 4 MG PO TABS: 4.0000 mg | ORAL_TABLET | Freq: Four times a day (QID) | ORAL | Status: DC | PRN

## 2015-04-06 MED ORDER — AMMONIA AROMATIC IN INHA
RESPIRATORY_TRACT | Status: AC
  Administered 2015-04-06: 15:00:00
  Filled 2015-04-06: qty 10

## 2015-04-06 MED ORDER — NALOXONE HCL 2 MG/2ML IJ SOSY
2.0000 mg | PREFILLED_SYRINGE | Freq: Once | INTRAMUSCULAR | Status: AC
  Administered 2015-04-06: 2 mg via INTRAVENOUS

## 2015-04-06 MED ORDER — ENSURE ENLIVE PO LIQD
237.0000 mL | Freq: Two times a day (BID) | ORAL | Status: DC
  Administered 2015-04-07: 237 mL via ORAL

## 2015-04-06 MED ORDER — KETOROLAC TROMETHAMINE 30 MG/ML IJ SOLN
30.0000 mg | Freq: Three times a day (TID) | INTRAMUSCULAR | Status: DC | PRN
  Administered 2015-04-07: 30 mg via INTRAVENOUS
  Filled 2015-04-06: qty 1

## 2015-04-06 MED ORDER — ADULT MULTIVITAMIN W/MINERALS CH
1.0000 | ORAL_TABLET | Freq: Every day | ORAL | Status: DC
  Administered 2015-04-07 – 2015-04-08 (×2): 1 via ORAL
  Filled 2015-04-06 (×2): qty 1

## 2015-04-06 MED ORDER — ENOXAPARIN SODIUM 40 MG/0.4ML ~~LOC~~ SOLN
40.0000 mg | SUBCUTANEOUS | Status: DC
  Administered 2015-04-06 – 2015-04-07 (×2): 40 mg via SUBCUTANEOUS
  Filled 2015-04-06 (×3): qty 0.4

## 2015-04-06 MED ORDER — ONDANSETRON HCL 4 MG/2ML IJ SOLN: 4.0000 mg | Freq: Four times a day (QID) | INTRAMUSCULAR | Status: DC | PRN

## 2015-04-06 MED ORDER — AMPHETAMINE-DEXTROAMPHETAMINE 10 MG PO TABS
20.0000 mg | ORAL_TABLET | Freq: Every day | ORAL | Status: DC
  Administered 2015-04-07 – 2015-04-08 (×2): 20 mg via ORAL
  Filled 2015-04-06 (×2): qty 2

## 2015-04-06 NOTE — ED Notes (Addendum)
Poison Control notified. Recommending magnesium level. Supportive care- watch for seizures Possible vomiting. Repeat EKG in a couple of hours.

## 2015-04-06 NOTE — ED Notes (Signed)
With triage pt tearful and dozes off in conversation and delay with response; a/o otherwise. Post narcan pt tearful but able to stay alert without dozing.

## 2015-04-06 NOTE — H&P (Signed)
[facility] History and Physical  [name] UEA:[id number] DOB: Jan 05, 1986 DOA: 04/06/2015  Referring physician: Benjiman Core, MD PCP: No primary care provider on file.   Chief Complaint: Altered mental status.  HPI: [name] is a 30 y.o. female with a past medical history of alcohol abuse, ADHD, anxiety was brought to the emergency department due to above chief complaint.  Per patient's spouse and mother. She was found in her chart facedown by end date for her. The patient did not fall and apparently passed out going for on the ground. EMS describes her pupils last being a small and the patient had a somewhat positive response to naloxone.   When seen in the emergency department, the patient was still confused, history was provided by her spouse and her mother. She was anxious and tearful. She denied using any recreational drugs or alcohol earlier. She denied having any prodromal symptoms before she passed out. Urine toxicology shows positivity to THC/cannabis, opioids, benzodiazepines and amphetamines (the patient is on Adderall)   Review of Systems:  Unable to review.  Past Medical History  Diagnosis Date  . Alcohol abuse    History reviewed. No pertinent past surgical history. Social History:  reports that she drinks alcohol. She reports that she uses illicit drugs (Marijuana). Her tobacco history is not on file.  Allergies  Allergen Reactions  . Sulfa Antibiotics     Reaction unknown.  Early childhood hx.     History reviewed. No pertinent family history.  Prior to Admission medications   Medication Sig Start Date End Date Taking? Authorizing Provider  AMOXICILLIN PO Take 1,000 mg by mouth 2 (two) times daily.    Yes Historical Provider, MD  amphetamine-dextroamphetamine (ADDERALL XR) 30 MG 24 hr capsule Take 30 mg by mouth daily.   Yes Historical Provider, MD  amphetamine-dextroamphetamine (ADDERALL) 20 MG tablet Take 20 mg by mouth daily.   Yes  Historical Provider, MD  Aspirin-Acetaminophen-Caffeine (GOODY HEADACHE PO) Take by mouth.   Yes Historical Provider, MD  Ibuprofen (MOTRIN PO) Take by mouth.   Yes Historical Provider, MD  Multiple Vitamin (MULTIVITAMIN WITH MINERALS) TABS tablet Take 1 tablet by mouth daily.   Yes Historical Provider, MD  SERTRALINE HCL PO Take by mouth daily.   Yes Historical Provider, MD   Physical Exam: Filed Vitals:   04/06/15 1945 04/06/15 2011 04/06/15 2100 04/06/15 2200  BP: 128/78 129/73 113/57 115/65  Pulse:  105    Temp:  97.8 F (36.6 C) 97.7 F (36.5 C)   TempSrc:  Oral Oral   Resp: 18 19 11 11   Height:   5\' 4"  (1.626 m)   Weight:   50.7 kg (111 lb 12.4 oz)   SpO2:  99% 95% 96%    Wt Readings from Last 3 Encounters:  04/06/15 50.7 kg (111 lb 12.4 oz)    General:  Tearful, mildly confused and mildly anxious. Eyes: PERRL, normal lids, irises & conjunctiva ENT: grossly normal hearing, lips & tongue. Oral mucosa is dry.            Dentition in poor state of repair. Neck: no LAD, masses or thyromegaly Cardiovascular: RRR, no m/r/g. No LE edema. Telemetry: SR, no arrhythmias  Respiratory: CTA bilaterally, no w/r/r. Normal respiratory effort. Abdomen: soft, ntnd Skin: positive for facial acne. Musculoskeletal: grossly normal tone BUE/BLE Psychiatric: grossly normal mood and affect, speech fluent and appropriate Neurologic: Awake, alert, oriented 2, partially oriented to time, grossly non-focal.  Labs on Admission:  Basic Metabolic Panel:  Recent Labs Lab 04/06/15 1540  NA 140  K 3.7  CL 105  CO2 25  GLUCOSE 96  BUN 11  CREATININE 0.65  CALCIUM 9.0  MG 2.0   Liver Function Tests:  Recent Labs Lab 04/06/15 1540  AST 14*  ALT 11*  ALKPHOS 49  BILITOT 0.8  PROT 6.9  ALBUMIN 4.2    Recent Labs Lab 04/06/15 1555  AMMONIA 36*   CBC:  Recent Labs Lab 04/06/15 1540  WBC 4.7  NEUTROABS 2.4  HGB 12.8  HCT 37.5  MCV 89.1  PLT 204    CBG:  Recent Labs Lab 04/06/15 1517  GLUCAP 97    Radiological Exams on Admission: Ct Head Wo Contrast  04/06/2015  CLINICAL DATA:  Altered mental status. Drug overdose. Initial encounter. EXAM: CT HEAD WITHOUT CONTRAST TECHNIQUE: Contiguous axial images were obtained from the base of the skull through the vertex without intravenous contrast. COMPARISON:  None. FINDINGS: The brain appears normal without hemorrhage, infarct, mass lesion, mass effect, midline shift or abnormal extra-axial fluid collection. No hydrocephalus or pneumocephalus. The calvarium is intact. Imaged paranasal sinuses and mastoid air cells are clear. IMPRESSION: Negative head CT. Electronically Signed   By: Drusilla Kanner M.D.   On: 04/06/2015 16:19   Dg Chest Portable 1 View  04/06/2015  CLINICAL DATA:  Altered mental status. EXAM: PORTABLE CHEST 1 VIEW COMPARISON:  None. FINDINGS: The heart size and mediastinal contours are within normal limits. Both lungs are clear. The visualized skeletal structures are unremarkable. IMPRESSION: No active disease. Electronically Signed   By: Marlan Palau M.D.   On: 04/06/2015 15:28    EKG: Independently reviewed. Vent. rate 118 BPM PR interval 137 ms QRS duration 78 ms QT/QTc 383/537 ms P-R-T axes 79 83 1 Sinus tachycardia Atrial premature complex Consider right atrial enlargement Borderline repolarization abnormality Prolonged QT interval  Assessment/Plan Principal Problem:   Altered mental status The patient mental status is returning to baseline, but she is still mildly confused. Admit to a stepdown for close monitoring. Continue supplemental oxygen. Continue IV fluids. Neuro checks.  Active Problems:   Polysubstance abuse The patient denies any recreational drug use. However, urine toxicology ordered by the emergency department is positive for cannabis, benzodiazepines, opioids and amphetamine (the patient is on Adderall). Consider psych evaluation in  a.m.    Anxiety Stable at this time. Start anxiolytic if needed later.    ADHD (attention deficit hyperactivity disorder) Continue Adderall. Patient should follow up with her primary psychiatrist.    Code Status: Full code. DVT Prophylaxis:Lovenox SQ. Family Communication: Her mother and his spouse are present in the room. Disposition Plan: Admit to a stepdown for close monitoring.  Time spent:Over 90 minutes were spent during the process of this admission.  Bobette Mo, M.D. [facility] Pager [phone].

## 2015-04-06 NOTE — ED Notes (Signed)
Recommend repeating EKG per Poison Control

## 2015-04-06 NOTE — ED Notes (Signed)
Patient transported to CT 

## 2015-04-06 NOTE — ED Notes (Signed)
Admission Dr in with Patient

## 2015-04-06 NOTE — ED Notes (Signed)
PT aware of urine sample 

## 2015-04-06 NOTE — ED Notes (Addendum)
Pt not responding to sternal rub and snoring; Pickering at bedside. Pt does not respond to ammonia inhalent.

## 2015-04-06 NOTE — ED Notes (Signed)
Patient did not respond to second narcan dose. Kohot, MD made aware. MD to bedside to assess patient.

## 2015-04-06 NOTE — ED Notes (Signed)
Pt a/o to person, place, time, and situation.

## 2015-04-06 NOTE — ED Provider Notes (Addendum)
CSN: [id number]     Arrival date & time 04/06/15  1420 History   First MD Initiated Contact with Patient 04/06/15 1432     Chief Complaint  Patient presents with  . Drug Overdose   Level V caveat due to altered mental status.  Patient is a 30 y.o. female presenting with Overdose.  Drug Overdose  Patient brought in with altered mental status. Reportedly found laying in the front yard. Had reportedly been doing gardening. Initially able to provide minimal history. Later when more awake able to tell me she's been taking some Motrin and Goody's powder for her dental pain. Denies overdose. Denies suicidal thoughts. Denies substance abuse.  Past Medical History  Diagnosis Date  . Alcohol abuse    History reviewed. No pertinent past surgical history. No family history on file. Social History  Substance Use Topics  . Smoking status: Unknown If Ever Smoked  . Smokeless tobacco: None  . Alcohol Use: Yes   OB History    No data available     Review of Systems  Unable to perform ROS: Mental status change      Allergies  Sulfa antibiotics  Home Medications   Prior to Admission medications   Medication Sig Start Date End Date Taking? Authorizing Provider  AMOXICILLIN PO Take by mouth.   Yes Historical Provider, MD  amphetamine-dextroamphetamine (ADDERALL XR) 30 MG 24 hr capsule Take 30 mg by mouth daily.   Yes Historical Provider, MD  amphetamine-dextroamphetamine (ADDERALL) 20 MG tablet Take 20 mg by mouth daily.   Yes Historical Provider, MD  Multiple Vitamin (MULTIVITAMIN WITH MINERALS) TABS tablet Take 1 tablet by mouth daily.   Yes Historical Provider, MD  SERTRALINE HCL PO Take by mouth daily.   Yes Historical Provider, MD   BP 125/101 mmHg  Pulse 83  Temp(Src) 96.2 F (35.7 C) (Rectal)  Resp 10  Ht 5\' 5"  (1.651 m)  Wt 130 lb (58.968 kg)  BMI 21.63 kg/m2  SpO2 100%  LMP  Physical Exam  Constitutional: She appears well-developed.  HENT:  Head: Atraumatic.  Eyes:   Pupils somewhat constricted  Cardiovascular: Normal rate.   Pulmonary/Chest: Effort normal. She has no wheezes. She has no rales.  Abdominal: There is no tenderness.  Musculoskeletal: She exhibits no edema.  Neurological:  Initially decreased mental status. Would go back to sleep quickly after talking. Later even more decreased mental status. The response to point to Narcan. Gag reflex had pretty much gone away. Then given to Narcan in mental status improved. Now able to talk.  Skin: Skin is warm.    ED Course  Procedures (including critical care time) Labs Review Labs Reviewed  COMPREHENSIVE METABOLIC PANEL  ETHANOL  URINALYSIS, ROUTINE W REFLEX MICROSCOPIC (NOT AT [facility])  URINE RAPID DRUG SCREEN, HOSP PERFORMED  PREGNANCY, URINE  CBC WITH DIFFERENTIAL/PLATELET  SALICYLATE LEVEL  ACETAMINOPHEN LEVEL  PROTIME-INR  AMMONIA  MAGNESIUM  CBG MONITORING, ED    Imaging Review Dg Chest Portable 1 View  04/06/2015  CLINICAL DATA:  Altered mental status. EXAM: PORTABLE CHEST 1 VIEW COMPARISON:  None. FINDINGS: The heart size and mediastinal contours are within normal limits. Both lungs are clear. The visualized skeletal structures are unremarkable. IMPRESSION: No active disease. Electronically Signed   By: Marlan Palau M.D.   On: 04/06/2015 15:28   I have personally reviewed and evaluated these images and lab results as part of my medical decision-making.   EKG Interpretation   Date/Time:  Monday April 06 2015  14:40:38 EDT Ventricular Rate:  118 PR Interval:  137 QRS Duration: 78 QT Interval:  383 QTC Calculation: 537 R Axis:   83 Text Interpretation:  Sinus tachycardia Atrial premature complex Consider  right atrial enlargement Borderline repolarization abnormality Prolonged  QT interval Confirmed by Rubin Payor  MD, Harrold Donath [phone]) on 04/06/2015  2:51:50 PM      MDM   Final diagnoses:  Altered mental status, unspecified altered mental status type    Patient with  altered mental status. Denies narcotic use but mental status improved greatly with Narcan. Reportedly had been taking Goody's powder and Motrin. Lab work pending. Denies suicidal or homicidal thoughts. Will continue to monitor.  CRITICAL CARE Performed by: Billee Cashing Total critical care time: 30 minutes Critical care time was exclusive of separately billable procedures and treating other patients. Critical care was necessary to treat or prevent imminent or life-threatening deterioration. Critical care was time spent personally by me on the following activities: development of treatment plan with patient and/or surrogate as well as nursing, discussions with consultants, evaluation of patient's response to treatment, examination of patient, obtaining history from patient or surrogate, ordering and performing treatments and interventions, ordering and review of laboratory studies, ordering and review of radiographic studies, pulse oximetry and re-evaluation of patient's condition.     Benjiman Core, MD 04/06/15 1551  Benjiman Core, MD 04/06/15 [phone]

## 2015-04-06 NOTE — ED Notes (Signed)
Per EMS, patient was found in front yard facedown by her neighbor. Patient did not fall. Patient was sitting down to do gardening when she went went forward. Per EMS, patient's pupils appear small. Patient denies alcohol or taking any substances today. Patient takes adderall and ibuprofen.  Patient falls asleep mid-sentence, however, is alert/oriented while awake.

## 2015-04-06 NOTE — ED Notes (Addendum)
Suction provided to mouth post snoring; pt spontaneously opens eyes; eyes bilaterally dilated. Pickering at bedside.  With eyes opening spontaneously pt states "took to much ibuprofen and goodie powder" because mouth and head hurting; denies SI/HI. Pt tearful.

## 2015-04-07 ENCOUNTER — Inpatient Hospital Stay (HOSPITAL_COMMUNITY): Payer: Medicaid Other

## 2015-04-07 ENCOUNTER — Inpatient Hospital Stay (HOSPITAL_COMMUNITY)
Admit: 2015-04-07 | Discharge: 2015-04-07 | Disposition: A | Discharge status: Home / Self Care | Payer: Medicaid Other | Attending: Cardiovascular Disease | Admitting: Cardiovascular Disease

## 2015-04-07 ENCOUNTER — Encounter (HOSPITAL_COMMUNITY): Payer: Self-pay

## 2015-04-07 DIAGNOSIS — R55 Syncope and collapse: Secondary | ICD-10-CM

## 2015-04-07 DIAGNOSIS — R404 Transient alteration of awareness: Secondary | ICD-10-CM

## 2015-04-07 DIAGNOSIS — F4325 Adjustment disorder with mixed disturbance of emotions and conduct: Secondary | ICD-10-CM

## 2015-04-07 DIAGNOSIS — G9340 Encephalopathy, unspecified: Secondary | ICD-10-CM

## 2015-04-07 DIAGNOSIS — F1910 Other psychoactive substance abuse, uncomplicated: Secondary | ICD-10-CM

## 2015-04-07 DIAGNOSIS — R4020 Unspecified coma: Secondary | ICD-10-CM | POA: Insufficient documentation

## 2015-04-07 LAB — COMPREHENSIVE METABOLIC PANEL
ALT: 12 U/L — ABNORMAL LOW (ref 14–54)
ANION GAP: 8 (ref 5–15)
AST: 12 U/L — ABNORMAL LOW (ref 15–41)
Albumin: 3.7 g/dL (ref 3.5–5.0)
Alkaline Phosphatase: 42 U/L (ref 38–126)
BILIRUBIN TOTAL: 1 mg/dL (ref 0.3–1.2)
BUN: 11 mg/dL (ref 6–20)
CO2: 25 mmol/L (ref 22–32)
Calcium: 8.7 mg/dL — ABNORMAL LOW (ref 8.9–10.3)
Chloride: 111 mmol/L (ref 101–111)
Creatinine, Ser: 0.62 mg/dL (ref 0.44–1.00)
GFR calc Af Amer: >60 mL/min (ref >60)
Glucose, Bld: 86 mg/dL (ref 65–99)
POTASSIUM: 3.8 mmol/L (ref 3.5–5.1)
Sodium: 144 mmol/L (ref 135–145)
TOTAL PROTEIN: 5.9 g/dL — AB (ref 6.5–8.1)

## 2015-04-07 LAB — MRSA PCR SCREENING: MRSA by PCR: NEGATIVE

## 2015-04-07 LAB — CBC WITH DIFFERENTIAL/PLATELET
BASOS ABS: 0 10*3/uL (ref 0.0–0.1)
Basophils Relative: 1 %
EOS PCT: 3 %
Eosinophils Absolute: 0.1 10*3/uL (ref 0.0–0.7)
HEMATOCRIT: 34.4 % — AB (ref 36.0–46.0)
HEMOGLOBIN: 11.6 g/dL — AB (ref 12.0–15.0)
LYMPHS PCT: 49 %
Lymphs Abs: 1.7 10*3/uL (ref 0.7–4.0)
MCH: 30.2 pg (ref 26.0–34.0)
MCHC: 33.7 g/dL (ref 30.0–36.0)
MCV: 89.6 fL (ref 78.0–100.0)
Monocytes Absolute: 0.3 10*3/uL (ref 0.1–1.0)
Monocytes Relative: 8 %
NEUTROS ABS: 1.3 10*3/uL — AB (ref 1.7–7.7)
NEUTROS PCT: 39 %
PLATELETS: 200 10*3/uL (ref 150–400)
RBC: 3.84 MIL/uL — AB (ref 3.87–5.11)
RDW: 12.9 % (ref 11.5–15.5)
WBC: 3.5 10*3/uL — AB (ref 4.0–10.5)

## 2015-04-07 LAB — ECHOCARDIOGRAM COMPLETE
Height: 64 in
WEIGHTICAEL: 1788.37 [oz_av]

## 2015-04-07 MED ORDER — PREMIER PROTEIN SHAKE
11.0000 [oz_av] | Freq: Two times a day (BID) | ORAL | Status: DC
  Administered 2015-04-07: 11 [oz_av] via ORAL
  Filled 2015-04-07: qty 325.31

## 2015-04-07 MED ORDER — DULOXETINE HCL 20 MG PO CPEP
20.0000 mg | ORAL_CAPSULE | Freq: Every day | ORAL | Status: DC
  Administered 2015-04-07 – 2015-04-08 (×2): 20 mg via ORAL
  Filled 2015-04-07 (×2): qty 1

## 2015-04-07 NOTE — Progress Notes (Signed)
Initial Nutrition Assessment  DOCUMENTATION CODES:   Severe malnutrition in context of acute illness/injury  INTERVENTION:   -Provide Premier Protein shake BID, each provides 160 kcal and 30 g protein -Placed lunch meal order for patient -RD to continue to monitor   NUTRITION DIAGNOSIS:   Malnutrition related to acute illness as evidenced by percent weight loss, energy intake < or equal to 50% for > or equal to 5 days, moderate depletion of body fat.  GOAL:   Patient will meet greater than or equal to 90% of their needs  MONITOR:   PO intake, Supplement acceptance, Labs, Weight trends, I & O's  REASON FOR ASSESSMENT:   Malnutrition Screening Tool    ASSESSMENT:   30 year old female with a history of polysubstance abuse including THC, alcohol, tobacco presented after being found face down outside near her garden by her neighbor. It is unclear how long she was down. Apparently, patient was given Narcan by EMS with minimal response. The last thing the patient remembered was working in her garden setting up some type of stone border. The next thing she remembered she was in the emergency department. The patient denies any previous history of syncope. She states that she has been clean and sober for a number of years, but that she does smoke THC. She also recently took 4 Vicodin that her husband bought from somebody else because of dental pain. She denies any other illegal drug use. She states that she takes Adderall for her ADHD. She denies any recent prodromal symptoms including chest discomfort, palpitations, or aura. The patient had minimal response to Narcan per EMS. She was confused in the emergency department. Urine drug screen was positive for THC, opiates, benzodiazepines, amphetamines. BMP, hepatic enzymes, and CBC were unremarkable.  Patient in room with RN at bedside. Pt reports having norovirus 3 weeks ago and has been subsisting on water, ginger ale and crackers during that  time period. Prior to these symptoms, pt states she would eat french toast and eggs or toast with jelly for breakfast, lunch would be a meal from [employer] like fish and greens and dinner would be a typical hot meal consisting of a protein, vegetable and starch. Pt has not eaten yet today, states she has had mostly candy. Pt with Ensure at bedside, states she does not like the taste. RD to provide other protein options. Pt denies any swallowing issues but states that she does have bad teeth but denies that this prevents her from eating. Patient states she is very hungry, RD placed lunch order for patient.   Per weight history, pt has lost 4 lb x 3 weeks, 3% wt loss x 3 weeks, which is significant for time frame. Pt reports UBW of 115-125 lb. Nutrition-Focused physical exam completed. Findings are moderate fat depletion, no muscle depletion, and no edema.   Labs reviewed. Medications: Multivitamin with minerals daily  Diet Order:  Diet regular Room service appropriate?: Yes; Fluid consistency:: Thin  Skin:  Reviewed, no issues  Last BM:  PTA  Height:   Ht Readings from Last 1 Encounters:  04/06/15  (1.626 m)    Weight:   Wt Readings from Last 1 Encounters:  04/06/15 111 lb 12.4 oz (50.7 kg)    Ideal Body Weight:  54.5 kg  BMI:  Body mass index is 19.18 kg/(m^2).  Estimated Nutritional Needs:   Kcal:  1500-1700  Protein:  75-85g  Fluid:  1.5-1.7L/day  EDUCATION NEEDS:   No education needs identified  at this time  Tilda Franco, MS, RD, LDN Pager: [phone] After Hours Pager: [phone]

## 2015-04-07 NOTE — Progress Notes (Signed)
Pt tranferred from ICU to room 1501. Pt AO x 4. Pt belongings are with family members. Pt made aware of how to use call bell and signed fall contract. No questions or concerns from the pt at this time.   Duayne Brideau W Casin Federici, RN

## 2015-04-07 NOTE — Progress Notes (Signed)
EEG completed; results pending.    

## 2015-04-07 NOTE — Consult Note (Signed)
[facility] Consult   Reason for Consult:  Depression and substance abuse Referring Physician:  Dr. Carles Collet Patient Identification: [name] MRN:  [id number] Principal Diagnosis: Polysubstance abuse Diagnosis:   Patient Active Problem List   Diagnosis Date Noted  . Altered mental status [R41.82] 04/06/2015  . Polysubstance abuse [F19.10] 04/06/2015  . Anxiety [F41.9] 04/06/2015  . ADHD (attention deficit hyperactivity disorder) [F90.9] 04/06/2015    Total Time spent with patient: 1 hour  Subjective:   [name] is a 30 y.o. female patient admitted with altered mental status.  HPI: [name] is a 30 y.o. female with a history of depression, anxiety and attention deficit hyperactive disorder admitted to [facility] with altered mental status. Patient reported "passed out while working in the friend garden", denied alcohol/drug abuse. Patient endorses smoking marijuana once a week. Patient reported she was taken ibuprofen for headaches. Patient urine drug screen is positive for tetrahydrocannabinol, opiates, benzodiazepines and amphetamines. Patient reportedly takes Adderall from primary care physician, opioids from her husband medication once or twice a week and does not know how she got benzodiazepines. Patient reportedly lives with her husband and 2 [place] children 97 and 18 years old and 67 years old stepson. 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 Patient denies suicidal/homicidal ideation, intention or plans, no evidence of psychosis.  Past Psychiatric History: Has been diagnosed with the depression, taking Zoloft which is not helpful and ADHD and has been taking Adderall from primary care physician who dismissed her from the clinic for unknown causes but reportedly unpaid bills.  Risk to Self: Is patient at risk for  suicide?: No (patient denies SI) Risk to Others:   Prior Inpatient Therapy:   Prior Outpatient Therapy:    Past Medical History:  Past Medical History  Diagnosis Date  . Alcohol abuse    History reviewed. No pertinent past surgical history. Family History: History reviewed. No pertinent family history. Family Psychiatric  History: Not significant Social History:  History  Alcohol Use  . Yes     History  Drug Use  . Yes  . Special: Marijuana    Social History   Social History  . Marital Status: Married    Spouse Name: N/A  . Number of Children: N/A  . Years of Education: N/A   Social History Main Topics  . Smoking status: Unknown If Ever Smoked  . Smokeless tobacco: None  . Alcohol Use: Yes  . Drug Use: Yes    Special: Marijuana  . Sexual Activity: Not Asked   Other Topics Concern  . None   Social History Narrative  . None   Additional Social History:    Allergies:   Allergies  Allergen Reactions  . Sulfa Antibiotics     Reaction unknown.  Early childhood hx.     Labs:  Results for orders placed or performed during the hospital encounter of 04/06/15 (from the past 48 hour(s))  CBG monitoring, ED     Status: None   Collection Time: 04/06/15  3:17 PM  Result Value Ref Range   Glucose-Capillary 97 65 - 99 mg/dL  Comprehensive metabolic panel     Status: Abnormal   Collection Time: 04/06/15  3:40 PM  Result Value Ref Range   Sodium 140 135 - 145 mmol/L   Potassium 3.7 3.5 - 5.1 mmol/L  Chloride 105 101 - 111 mmol/L   CO2 25 22 - 32 mmol/L   Glucose, Bld 96 65 - 99 mg/dL   BUN 11 6 - 20 mg/dL   Creatinine, Ser 0.65 0.44 - 1.00 mg/dL   Calcium 9.0 8.9 - 10.3 mg/dL   Total Protein 6.9 6.5 - 8.1 g/dL   Albumin 4.2 3.5 - 5.0 g/dL   AST 14 (L) 15 - 41 U/L   ALT 11 (L) 14 - 54 U/L   Alkaline Phosphatase 49 38 - 126 U/L   Total Bilirubin 0.8 0.3 - 1.2 mg/dL   GFR calc non Af Amer >60 >60 mL/min   GFR calc Af Amer >60 >60 mL/min    Comment:  (NOTE) The eGFR has been calculated using the CKD EPI equation. This calculation has not been validated in all clinical situations. eGFR's persistently <60 mL/min signify possible Chronic Kidney Disease.    Anion gap 10 5 - 15  Ethanol     Status: None   Collection Time: 04/06/15  3:40 PM  Result Value Ref Range   Alcohol, Ethyl (B) <5 <5 mg/dL    Comment:        LOWEST DETECTABLE LIMIT FOR SERUM ALCOHOL IS 5 mg/dL FOR MEDICAL PURPOSES ONLY   CBC with Differential     Status: None   Collection Time: 04/06/15  3:40 PM  Result Value Ref Range   WBC 4.7 4.0 - 10.5 K/uL   RBC 4.21 3.87 - 5.11 MIL/uL   Hemoglobin 12.8 12.0 - 15.0 g/dL   HCT 37.5 36.0 - 46.0 %   MCV 89.1 78.0 - 100.0 fL   MCH 30.4 26.0 - 34.0 pg   MCHC 34.1 30.0 - 36.0 g/dL   RDW 12.7 11.5 - 15.5 %   Platelets 204 150 - 400 K/uL   Neutrophils Relative % 52 %   Neutro Abs 2.4 1.7 - 7.7 K/uL   Lymphocytes Relative 36 %   Lymphs Abs 1.7 0.7 - 4.0 K/uL   Monocytes Relative 8 %   Monocytes Absolute 0.4 0.1 - 1.0 K/uL   Eosinophils Relative 3 %   Eosinophils Absolute 0.1 0.0 - 0.7 K/uL   Basophils Relative 1 %   Basophils Absolute 0.0 0.0 - 0.1 K/uL  Salicylate level     Status: None   Collection Time: 04/06/15  3:40 PM  Result Value Ref Range   Salicylate Lvl <7.9 2.8 - 30.0 mg/dL  Acetaminophen level     Status: Abnormal   Collection Time: 04/06/15  3:40 PM  Result Value Ref Range   Acetaminophen (Tylenol), Serum <10 (L) 10 - 30 ug/mL    Comment:        THERAPEUTIC CONCENTRATIONS VARY SIGNIFICANTLY. A RANGE OF 10-30 ug/mL MAY BE AN EFFECTIVE CONCENTRATION FOR MANY PATIENTS. HOWEVER, SOME ARE BEST TREATED AT CONCENTRATIONS OUTSIDE THIS RANGE. ACETAMINOPHEN CONCENTRATIONS >150 ug/mL AT 4 HOURS AFTER INGESTION AND >50 ug/mL AT 12 HOURS AFTER INGESTION ARE OFTEN ASSOCIATED WITH TOXIC REACTIONS.   Protime-INR     Status: None   Collection Time: 04/06/15  3:40 PM  Result Value Ref Range   Prothrombin  Time 14.6 11.6 - 15.2 seconds   INR 1.12 0.00 - 1.49  Magnesium     Status: None   Collection Time: 04/06/15  3:40 PM  Result Value Ref Range   Magnesium 2.0 1.7 - 2.4 mg/dL  Ammonia     Status: Abnormal   Collection Time: 04/06/15  3:55 PM  Result  Value Ref Range   Ammonia 36 (H) 9 - 35 umol/L  Urine rapid drug screen (hosp performed)     Status: Abnormal   Collection Time: 04/06/15  5:01 PM  Result Value Ref Range   Opiates POSITIVE (A) NONE DETECTED   Cocaine NONE DETECTED NONE DETECTED   Benzodiazepines POSITIVE (A) NONE DETECTED   Amphetamines POSITIVE (A) NONE DETECTED   Tetrahydrocannabinol POSITIVE (A) NONE DETECTED   Barbiturates NONE DETECTED NONE DETECTED    Comment:        DRUG SCREEN FOR MEDICAL PURPOSES ONLY.  IF CONFIRMATION IS NEEDED FOR ANY PURPOSE, NOTIFY LAB WITHIN 5 DAYS.        LOWEST DETECTABLE LIMITS FOR URINE DRUG SCREEN Drug Class       Cutoff (ng/mL) Amphetamine      1000 Barbiturate      200 Benzodiazepine   678 Tricyclics       938 Opiates          300 Cocaine          300 THC              50   Urinalysis, Routine w reflex microscopic     Status: Abnormal   Collection Time: 04/06/15  5:35 PM  Result Value Ref Range   Color, Urine YELLOW YELLOW   APPearance CLOUDY (A) CLEAR   Specific Gravity, Urine 1.016 1.005 - 1.030   pH 5.5 5.0 - 8.0   Glucose, UA NEGATIVE NEGATIVE mg/dL   Hgb urine dipstick MODERATE (A) NEGATIVE   Bilirubin Urine NEGATIVE NEGATIVE   Ketones, ur NEGATIVE NEGATIVE mg/dL   Protein, ur NEGATIVE NEGATIVE mg/dL   Nitrite NEGATIVE NEGATIVE   Leukocytes, UA NEGATIVE NEGATIVE  Pregnancy, urine     Status: None   Collection Time: 04/06/15  5:35 PM  Result Value Ref Range   Preg Test, Ur NEGATIVE NEGATIVE    Comment:        THE SENSITIVITY OF THIS METHODOLOGY IS >20 mIU/mL.   Urine microscopic-add on     Status: Abnormal   Collection Time: 04/06/15  5:35 PM  Result Value Ref Range   Squamous Epithelial / LPF 0-5 (A)  NONE SEEN   WBC, UA 0-5 0 - 5 WBC/hpf   RBC / HPF 6-30 0 - 5 RBC/hpf   Bacteria, UA RARE (A) NONE SEEN  MRSA PCR Screening     Status: None   Collection Time: 04/06/15  8:40 PM  Result Value Ref Range   MRSA by PCR NEGATIVE NEGATIVE    Comment:        The GeneXpert MRSA Assay (FDA approved for NASAL specimens only), is one component of a comprehensive MRSA colonization surveillance program. It is not intended to diagnose MRSA infection nor to guide or monitor treatment for MRSA infections.   Comprehensive metabolic panel     Status: Abnormal   Collection Time: 04/07/15  3:28 AM  Result Value Ref Range   Sodium 144 135 - 145 mmol/L   Potassium 3.8 3.5 - 5.1 mmol/L   Chloride 111 101 - 111 mmol/L   CO2 25 22 - 32 mmol/L   Glucose, Bld 86 65 - 99 mg/dL   BUN 11 6 - 20 mg/dL   Creatinine, Ser 0.62 0.44 - 1.00 mg/dL   Calcium 8.7 (L) 8.9 - 10.3 mg/dL   Total Protein 5.9 (L) 6.5 - 8.1 g/dL   Albumin 3.7 3.5 - 5.0 g/dL   AST 12 (L) 15 -  41 U/L   ALT 12 (L) 14 - 54 U/L   Alkaline Phosphatase 42 38 - 126 U/L   Total Bilirubin 1.0 0.3 - 1.2 mg/dL   GFR calc non Af Amer >60 >60 mL/min   GFR calc Af Amer >60 >60 mL/min    Comment: (NOTE) The eGFR has been calculated using the CKD EPI equation. This calculation has not been validated in all clinical situations. eGFR's persistently <60 mL/min signify possible Chronic Kidney Disease.    Anion gap 8 5 - 15  CBC WITH DIFFERENTIAL     Status: Abnormal   Collection Time: 04/07/15  3:28 AM  Result Value Ref Range   WBC 3.5 (L) 4.0 - 10.5 K/uL   RBC 3.84 (L) 3.87 - 5.11 MIL/uL   Hemoglobin 11.6 (L) 12.0 - 15.0 g/dL   HCT 34.4 (L) 36.0 - 46.0 %   MCV 89.6 78.0 - 100.0 fL   MCH 30.2 26.0 - 34.0 pg   MCHC 33.7 30.0 - 36.0 g/dL   RDW 12.9 11.5 - 15.5 %   Platelets 200 150 - 400 K/uL   Neutrophils Relative % 39 %   Neutro Abs 1.3 (L) 1.7 - 7.7 K/uL   Lymphocytes Relative 49 %   Lymphs Abs 1.7 0.7 - 4.0 K/uL   Monocytes Relative 8 %    Monocytes Absolute 0.3 0.1 - 1.0 K/uL   Eosinophils Relative 3 %   Eosinophils Absolute 0.1 0.0 - 0.7 K/uL   Basophils Relative 1 %   Basophils Absolute 0.0 0.0 - 0.1 K/uL    Current Facility-Administered Medications  Medication Dose Route Frequency Provider Last Rate Last Dose  . 0.9 % NaCl with KCl 20 mEq/ L  infusion   Intravenous Continuous Reubin Milan, MD 100 mL/hr at 04/07/15 [phone]    . amphetamine-dextroamphetamine (ADDERALL XR) 24 hr capsule 30 mg  30 mg Oral Daily Reubin Milan, MD   30 mg at 04/07/15 0934  . amphetamine-dextroamphetamine (ADDERALL) tablet 20 mg  20 mg Oral Daily Reubin Milan, MD   20 mg at 04/07/15 0934  . enoxaparin (LOVENOX) injection 40 mg  40 mg Subcutaneous Q24H Reubin Milan, MD   40 mg at 04/06/15 2111  . feeding supplement (ENSURE ENLIVE) (ENSURE ENLIVE) liquid 237 mL  237 mL Oral BID BM Reubin Milan, MD      . ketorolac (TORADOL) 30 MG/ML injection 30 mg  30 mg Intravenous Q8H PRN Reubin Milan, MD      . multivitamin with minerals tablet 1 tablet  1 tablet Oral Daily Reubin Milan, MD   1 tablet at 04/07/15 [phone]  . ondansetron (ZOFRAN) tablet 4 mg  4 mg Oral Q6H PRN Reubin Milan, MD       Or  . ondansetron [facility]) injection 4 mg  4 mg Intravenous Q6H PRN Reubin Milan, MD      . pantoprazole ([place]) EC tablet 40 mg  40 mg Oral Daily Reubin Milan, MD   40 mg at 04/06/15 2111    Musculoskeletal: Strength & Muscle Tone: decreased Gait & Station: unable to stand Patient leans: N/A  Psychiatric Specialty Exam: ROS endorses depression, ADHD and recent financial stresses. No Fever-chills, No Headache, No changes with Vision or hearing, reports vertigo No problems swallowing food or Liquids, No Chest pain, Cough or Shortness of Breath, No Abdominal pain, No Nausea or Vommitting, Bowel movements are regular, No Blood in stool or Urine, No dysuria, No  new skin rashes or bruises, No new joints  pains-aches,  No new weakness, tingling, numbness in any extremity, No recent weight gain or loss, No polyuria, polydypsia or polyphagia,  A full 10 point Review of Systems was done, except as stated above, all other Review of Systems were negative.  Blood pressure 126/77, pulse 105, temperature 97.8 F (36.6 C), temperature source Oral, resp. rate 16, height _0  (1.626 m), weight 50.7 kg (111 lb 12.4 oz), SpO2 98 %.Body mass index is 19.18 kg/(m^2).  General Appearance: Guarded  Eye Contact::  Good  Speech:  Clear and Coherent and Slow  Volume:  Decreased  Mood:  Depressed  Affect:  Constricted and Depressed  Thought Process:  Coherent and Goal Directed  Orientation:  Full (Time, Place, and Person)  Thought Content:  Rumination  Suicidal Thoughts:  No  Homicidal Thoughts:  No  Memory:  Immediate;   Good Recent;   Fair Remote;   Fair  Judgement:  Fair  Insight:  Fair  Psychomotor Activity:  Decreased  Concentration:  Fair  Recall:  Good  Fund of Knowledge:Good  Language: Good  Akathisia:  Negative  Handed:  Right  AIMS (if indicated):     Assets:  Communication Skills Desire for Improvement Housing Intimacy Leisure Time Resilience Social Support Talents/Skills Transportation Vocational/Educational  ADL's:  Impaired  Cognition: WNL  Sleep:      Treatment Plan Summary: Daily contact with patient to assess and evaluate symptoms and progress in treatment and Medication management  Safety concerns: Patient has no safety concerns and denies active suicidal and homicidal ideation and stated she has 2 small children she needed to be there for We start Cymbalta 20 mg daily for depression which can be titrated up and 60 mg as needed for clinical depression, patient provided verbal consent Appreciate psychiatric consultation and follow up as clinically required Please contact [phone] or [phone] if needs further assistance  Disposition: Refer to the unit social service  regarding providing list of Medicaid mental health providers in [place] area and [place] counselors. Patient does not meet criteria for psychiatric inpatient admission. Supportive therapy provided about ongoing stressors.  Durward Parcel., MD 04/07/2015 11:28 AM

## 2015-04-07 NOTE — Progress Notes (Signed)
PROGRESS NOTE  [name] YKD:[id number] DOB: 07/01/1985 DOA: 04/06/2015 PCP: No primary care provider on file. Brief History 30 year old female with a history of polysubstance abuse including THC, alcohol, tobacco presented after being found face down outside near her garden by her neighbor. It is unclear how long she was down. Apparently, patient was given Narcan by EMS with minimal response. The last thing the patient remembered was working in her garden setting up some type of stone border. The next thing she remembered she was in the emergency department. The patient denies any previous history of syncope. She states that she has been clean and sober for a number of years, but that she does smoke THC. She also recently took 4 Vicodin that her husband bought from somebody else because of dental pain. She denies any other illegal drug use. She states that she takes Adderall for her ADHD. She denies any recent prodromal symptoms including chest discomfort, palpitations, or aura. The patient had minimal response to Narcan per EMS. She was confused in the emergency department. Urine drug screen was positive for THC, opiates, benzodiazepines, amphetamines. BMP, hepatic enzymes, and CBC were unremarkable. EKG shows sinus rhythm with nonspecific T-wave changes. CT of the brain and chest x-ray were unremarkable. Patient was admitted for further workup for syncope.  Assessment/Plan: Syncope -pt appears to be very forthcoming regarding info about substance abuse -does not appear this may have been drug induced -EEG -echo -consult cardiology--?need for event monitor -orthostatics -UDS--positive for opiate, benzo, THC -remain on tele  Acute encephalopathy -Resolved -Mental status at baseline -Question if patient may have had a post ictal state  History of polysubstance abuse  -Patient has been clean for many years  -She takes Adderall for ADHD  -She states that her husband has bought  hydrocodone for her dental pain 2 days prior to admission   Anxiety/depression -Patient is very tearful  -Consult psychiatry for opinion   Hyperammonemia -questionable clinical significance as pt is A&O x 4 -ammonia = 36 -HIV -hep B surface antigen -hep c antibody    Family Communication:   Husband updated at beside Disposition Plan:   Home 1-2 days       Procedures/Studies: Ct Head Wo Contrast  04/06/2015  CLINICAL DATA:  Altered mental status. Drug overdose. Initial encounter. EXAM: CT HEAD WITHOUT CONTRAST TECHNIQUE: Contiguous axial images were obtained from the base of the skull through the vertex without intravenous contrast. COMPARISON:  None. FINDINGS: The brain appears normal without hemorrhage, infarct, mass lesion, mass effect, midline shift or abnormal extra-axial fluid collection. No hydrocephalus or pneumocephalus. The calvarium is intact. Imaged paranasal sinuses and mastoid air cells are clear. IMPRESSION: Negative head CT. Electronically Signed   By: Drusilla Kanner M.D.   On: 04/06/2015 16:19   Dg Chest Portable 1 View  04/06/2015  CLINICAL DATA:  Altered mental status. EXAM: PORTABLE CHEST 1 VIEW COMPARISON:  None. FINDINGS: The heart size and mediastinal contours are within normal limits. Both lungs are clear. The visualized skeletal structures are unremarkable. IMPRESSION: No active disease. Electronically Signed   By: Marlan Palau M.D.   On: 04/06/2015 15:28         Subjective: Patient denies fevers, chills, headache, chest pain, dyspnea, nausea, vomiting, diarrhea, abdominal pain, dysuria, hematuria. She denies any hematochezia, melena   Objective: Filed Vitals:   04/07/15 0600 04/07/15 0700 04/07/15 0800 04/07/15 0900  BP: 99/67 106/67 102/62 126/77  Pulse:  Temp:  97.8 F (36.6 C)    TempSrc:      Resp: Height:      Weight:      SpO2: 99% 99% 98% 98%    Intake/Output Summary (Last 24 hours) at 04/07/15 1122 Last data  filed at 04/07/15 1014  Gross per 24 hour  Intake 1338.33 ml  Output   1200 ml  Net 138.33 ml   Weight change:  Exam:   General:  Pt is alert, follows commands appropriately, not in acute distress  HEENT: No icterus, No thrush, No neck mass, [place]/AT  Cardiovascular: RRR, S1/S2, no rubs, no gallops  Respiratory: CTA bilaterally, no wheezing, no crackles, no rhonchi  Abdomen: Soft/+BS, non tender, non distended, no guarding; no hepatosplenomegaly  Extremities: No edema, No lymphangitis, No petechiae, No rashes, no synovitis  Data Reviewed: Basic Metabolic Panel:  Recent Labs Lab 04/06/15 1540 04/07/15 0328  NA 140 144  K 3.7 3.8  CL 105 111  CO2 25 25  GLUCOSE 96 86  BUN 11 11  CREATININE 0.65 0.62  CALCIUM 9.0 8.7*  MG 2.0  --    Liver Function Tests:  Recent Labs Lab 04/06/15 1540 04/07/15 0328  AST 14* 12*  ALT 11* 12*  ALKPHOS 49 42  BILITOT 0.8 1.0  PROT 6.9 5.9*  ALBUMIN 4.2 3.7   No results for input(s): LIPASE, AMYLASE in the last 168 hours.  Recent Labs Lab 04/06/15 1555  AMMONIA 36*   CBC:  Recent Labs Lab 04/06/15 1540 04/07/15 0328  WBC 4.7 3.5*  NEUTROABS 2.4 1.3*  HGB 12.8 11.6*  HCT 37.5 34.4*  MCV 89.1 89.6  PLT 204 200   Cardiac Enzymes: No results for input(s): CKTOTAL, CKMB, CKMBINDEX, TROPONINI in the last 168 hours. BNP: Invalid input(s): POCBNP CBG:  Recent Labs Lab 04/06/15 1517  GLUCAP 97    Recent Results (from the past 240 hour(s))  MRSA PCR Screening     Status: None   Collection Time: 04/06/15  8:40 PM  Result Value Ref Range Status   MRSA by PCR NEGATIVE NEGATIVE Final    Comment:        The GeneXpert MRSA Assay (FDA approved for NASAL specimens only), is one component of a comprehensive MRSA colonization surveillance program. It is not intended to diagnose MRSA infection nor to guide or monitor treatment for MRSA infections.      Scheduled Meds: . amphetamine-dextroamphetamine  30 mg Oral  Daily  . amphetamine-dextroamphetamine  20 mg Oral Daily  . enoxaparin (LOVENOX) injection  40 mg Subcutaneous Q24H  . feeding supplement (ENSURE ENLIVE)  237 mL Oral BID BM  . multivitamin with minerals  1 tablet Oral Daily  . pantoprazole  40 mg Oral Daily   Continuous Infusions: . 0.9 % NaCl with KCl 20 mEq / L 100 mL/hr at 04/07/15 0714     Cayden Granholm, DO  [facility] Pager [phone]  If 7PM-7AM, please contact night-coverage [email or web address] Password [email or web address] 04/07/2015, 11:22 AM   LOS: 1 day

## 2015-04-07 NOTE — Progress Notes (Signed)
RN offered patient restroom this AM. Patient refused at this time. Will continue to monitor.

## 2015-04-07 NOTE — Progress Notes (Signed)
*  PRELIMINARY RESULTS* Echocardiogram 2D Echocardiogram has been performed.  [name] 04/07/2015, 12:40 PM

## 2015-04-07 NOTE — Procedures (Signed)
ELECTROENCEPHALOGRAM REPORT  Date of Study: 04/07/2015  Patient's Name: [name] MRN: [id number] Date of Birth: [birth year]  Referring Provider: Catarina Hartshorn, DO  Indication: 30 year old female with a history of polysubstance abuse including THC, alcohol, tobacco presented after being found unresponsive. Patient was given Narcan by EMS with minimal response. Urine drug screen was positive for THC, opiates, benzodiazepines, amphetamines.  Medications: Toradol Adderal  Technical Summary: This is a multichannel digital EEG recording, using the international 10-20 placement system with electrodes applied with paste and impedances below 5000 ohms.    Description: The EEG record is mostly of the drowsy state with a symmetric background and a well-developed posterior dominant rhythm of 10-11 Hz, which is reactive to eye opening and closing.  Diffuse beta activity is seen, with a bilateral frontal preponderance.  No focal or generalized abnormalities are seen.  No focal or generalized epileptiform discharges are seen.  Stage II sleep is not seen.  Hyperventilation was performed, and produced no abnormalities.  ECG revealed normal cardiac rate and rhythm.  Impression: This is a normal routine EEG of the mostly drowsy state, with activating procedures.  A normal study does not rule out the possibility of a seizure disorder in this patient.  Adam R. Everlena Cooper, DO

## 2015-04-08 DIAGNOSIS — F419 Anxiety disorder, unspecified: Secondary | ICD-10-CM

## 2015-04-08 DIAGNOSIS — F900 Attention-deficit hyperactivity disorder, predominantly inattentive type: Secondary | ICD-10-CM

## 2015-04-08 LAB — BASIC METABOLIC PANEL
ANION GAP: 6 (ref 5–15)
BUN: 8 mg/dL (ref 6–20)
CALCIUM: 8.1 mg/dL — AB (ref 8.9–10.3)
CO2: 22 mmol/L (ref 22–32)
CREATININE: 0.55 mg/dL (ref 0.44–1.00)
Chloride: 110 mmol/L (ref 101–111)
GFR calc Af Amer: >60 mL/min (ref >60)
GFR calc non Af Amer: >60 mL/min (ref >60)
GLUCOSE: 94 mg/dL (ref 65–99)
Potassium: 3.8 mmol/L (ref 3.5–5.1)
Sodium: 138 mmol/L (ref 135–145)

## 2015-04-08 LAB — HEPATITIS B SURFACE ANTIGEN: HEP B S AG: NEGATIVE

## 2015-04-08 LAB — HIV ANTIBODY (ROUTINE TESTING W REFLEX): HIV SCREEN 4TH GENERATION: NONREACTIVE

## 2015-04-08 LAB — HEPATITIS C ANTIBODY: HCV Ab: <0.1 s/co ratio (ref 0.0–0.9)

## 2015-04-08 MED ORDER — DULOXETINE HCL 20 MG PO CPEP: 20.0000 mg | ORAL_CAPSULE | Freq: Every day | ORAL | Status: DC

## 2015-04-08 MED ORDER — IBUPROFEN 600 MG PO TABS
600.0000 mg | ORAL_TABLET | Freq: Once | ORAL | Status: AC
  Administered 2015-04-08: 600 mg via ORAL
  Filled 2015-04-08: qty 1
  Filled 2015-04-08: qty 3

## 2015-04-08 NOTE — Consult Note (Signed)
CARDIOLOGY CONSULT NOTE      Patient ID: [name] MRN: [id number] DOB/AGE: 07/02/1985 30 y.o.  Admit date: 04/06/2015 Referring PhysicianJessica Juanetta Gosling, DO Primary PhysicianNo primary care provider on file. Primary Cardiologist new Reason for Consultation syncope  HPI: 30 year old woman with a history of polysubstance abuse. She had a syncopal episode while she was doing some gardening. She was squatting down and then was later found in a position where she was kneeling down with her head on the ground, unresponsive. That day, she had had an episode of vomiting. She had not been eating and drinking normally. The night before, she had not slept at all due to stressors at work. Essentially, she felt poorly the whole day from the lack of sleep. She then had the episode of vomiting. She then passed out. She had also taken 4 Vicodin tablets prior to the syncopal episode. The time course of this is unclear. She does commonly get lightheaded when she goes from squatting to standing. She does not drink much water. She typically drinks sodas.  She had a normal echocardiogram. Her ECG did not show any significant arrhythmia.  She denies any recent chest discomfort or shortness of breath.  Review of systems complete and found to be negative unless listed above   Past Medical History  Diagnosis Date  . Alcohol abuse   . Current smoker   . Current every day smoker     History reviewed. No pertinent family history.  Social History   Social History  . Marital Status: Married    Spouse Name: N/A  . Number of Children: N/A  . Years of Education: N/A   Occupational History  . Not on file.   Social History Main Topics  . Smoking status: Current Every Day Smoker  . Smokeless tobacco: Not on file  . Alcohol Use: 0.0 oz/week    0 Standard drinks or equivalent per week  . Drug Use: Yes    Special: Marijuana  . Sexual Activity: Not on file   Other Topics Concern  . Not on file    Social History Narrative    History reviewed. No pertinent past surgical history.   Prescriptions prior to admission  Medication Sig Dispense Refill Last Dose  . AMOXICILLIN PO Take 1,000 mg by mouth 2 (two) times daily.    04/06/2015 at Unknown time  . amphetamine-dextroamphetamine (ADDERALL XR) 30 MG 24 hr capsule Take 30 mg by mouth daily.   04/06/2015 at Unknown time  . amphetamine-dextroamphetamine (ADDERALL) 20 MG tablet Take 20 mg by mouth daily.   04/06/2015 at Unknown time  . Aspirin-Acetaminophen-Caffeine (GOODY HEADACHE PO) Take by mouth.   04/06/2015 at Unknown time  . Ibuprofen (MOTRIN PO) Take by mouth.   04/06/2015 at Unknown time  . Multiple Vitamin (MULTIVITAMIN WITH MINERALS) TABS tablet Take 1 tablet by mouth daily.   Past Week at Unknown time  . SERTRALINE HCL PO Take by mouth daily.   04/06/2015 at Unknown time    Physical Exam: Vitals:   Filed Vitals:   04/07/15 1300 04/07/15 1420 04/07/15 2300 04/08/15 0527  BP: 123/73 100/40 116/73 107/55  Pulse:  92 91 75  Temp:  98.1 F (36.7 C) 98.1 F (36.7 C) 98 F (36.7 C)  TempSrc:  Oral Oral Oral  Resp:  Height:      Weight:      SpO2:  100% 99% 99%   I&O's:   Intake/Output Summary (Last 24 hours) at  04/08/15 1149 Last data filed at 04/08/15 0900  Gross per 24 hour  Intake 2751.67 ml  Output      2 ml  Net 2749.67 ml   Physical exam:  [place]/AT EOMI No JVD, No carotid bruit RRR S1S2  No wheezing Soft. NT, nondistended No edema. No focal motor or sensory deficits Normal affect Skin: multiple tattoos  Labs:   Lab Results  Component Value Date   WBC 3.5* 04/07/2015   HGB 11.6* 04/07/2015   HCT 34.4* 04/07/2015   MCV 89.6 04/07/2015   PLT 200 04/07/2015    Recent Labs Lab 04/07/15 0328 04/08/15 0523  NA 144 138  K 3.8 3.8  CL 111 110  CO2 25 22  BUN 11 8  CREATININE 0.62 0.55  CALCIUM 8.7* 8.1*  PROT 5.9*  --   BILITOT 1.0  --   ALKPHOS 42  --   ALT 12*  --   AST 12*  --    GLUCOSE 86 94   No results found for: CKTOTAL, CKMB, CKMBINDEX, TROPONINI No results found for: CHOL No results found for: HDL No results found for: LDLCALC No results found for: TRIG No results found for: CHOLHDL No results found for: LDLDIRECT    Radiology:Negative head CT EKG: Sinus tachycardia, otherwise normal, follow-up showed normal sinus rhythm with nonspecific T-wave changes in leads V1 and V2  ASSESSMENT AND PLAN:  Principal Problem:   Polysubstance abuse Active Problems:   Altered mental status   Anxiety   ADHD (attention deficit hyperactivity disorder)   Syncope   Acute encephalopathy  Syncope: I think this was likely caused by a combination of lack of sleep plus dehydration. It seems likely she probably tried to stand up from a squatting position and then passed out. I encouraged her to stay well hydrated. I encouraged her to stop drinking sodas and drink more water. She obviously needs to stop using narcotics and illegal drugs as well. In general, she needs to clean up her lifestyle. I don't think an event monitor is needed at this time. No further cardiac workup given that she had a normal echocardiogram.  We'll sign off. Please call with questions.  Signed:   Fredric Mare, MD, [facility] 04/08/2015, 11:49 AM

## 2015-04-08 NOTE — Care Management Note (Signed)
Case Management Note  Patient Details  Name: [name] MRN: [id number] Date of Birth: [birth year]  Subjective/Objective:                  Polysubstance abuse/overdose Action/Plan: Discharge planning Expected Discharge Date:   (unknown)               Expected Discharge Plan:  Home/Self Care  In-House Referral:     Discharge planning Services  CM Consult  Post Acute Care Choice:    Choice offered to:  Patient  DME Arranged:    DME Agency:     HH Arranged:    [place] Agency:     Status of Service:  Completed, signed off  Medicare Important Message Given:    Date Medicare IM Given:    Medicare IM give by:    Date Additional Medicare IM Given:    Additional Medicare Important Message give by:     If discussed at [place] of Stay Meetings, dates discussed:    Additional Comments: CM met with pt who states she has been dismissed from her PCP. Pt verbalized understanding she needs to call the number on her Medicaid card to secure a new PCP OR can use the [facility] walk in clinic to establish herself with a [place] PCP; CM gave pt [place] pamphlet.  No other CM needs were communicated.  Dellie Catholic, RN 04/08/2015, 12:50 PM

## 2015-04-08 NOTE — Discharge Summary (Addendum)
Physician Discharge Summary  [name] ZOX:[id number] DOB: November 19, 1985 DOA: 04/06/2015  PCP: No primary care provider on file.  Admit date: 04/06/2015 Discharge date: 04/08/2015  Time spent: 35 minutes  Recommendations for Outpatient Follow-up:  1. Stop all illegal/non-prescribed meds   Discharge Diagnoses:  Principal Problem:   Polysubstance abuse Active Problems:   Altered mental status   Anxiety   ADHD (attention deficit hyperactivity disorder)   Syncope   Acute encephalopathy   Discharge Condition: improved  Diet recommendation: regular  Filed Weights   04/06/15 1431 04/06/15 2100  Weight: 58.968 kg (130 lb) 50.7 kg (111 lb 12.4 oz)    History of present illness:  [name] is a 30 y.o. female with a past medical history of alcohol abuse, ADHD, anxiety was brought to the emergency department due to above chief complaint.  Per patient's spouse and mother. She was found in her chart facedown by end date for her. The patient did not fall and apparently passed out going for on the ground. EMS describes her pupils last being a small and the patient had a somewhat positive response to naloxone.   When seen in the emergency department, the patient was still confused, history was provided by her spouse and her mother. She was anxious and tearful. She denied using any recreational drugs or alcohol earlier. She denied having any prodromal symptoms before she passed out. Urine toxicology shows positivity to THC/cannabis, opioids, benzodiazepines and amphetamines (the patient is on Adderall)  Hospital Course:  Syncope -EEG -echo -consult cardiology- no need for event monitor: likely caused by a combination of lack of sleep plus dehydration. It seems likely she probably tried to stand up from a squatting position and then passed out. I encouraged her to stay well hydrated. I encouraged her to stop drinking sodas and drink more water. She obviously needs to stop using narcotics  and illegal drugs as well. In general, she needs to clean up her lifestyle. -orthostatics negative -UDS--positive for opiate, benzo, THC  Acute encephalopathy -Resolved -Mental status at baseline -Question if patient may have had a post ictal state  History of polysubstance abuse  -Patient has been clean for many years  -She takes Adderall for ADHD  -She states that her husband has bought hydrocodone for her dental pain 2 days prior to admission   Anxiety/depression -seen by psych Cymbalta 20 mg daily for depression which can be titrated up and 60 mg as needed for clinical depression  Hyperammonemia -questionable clinical significance as pt is A&O x 4 -HIV/hep B surface antigen/hep c antibody- negative  Severe malnutrition per nutrition notes  Procedures:  echo  Consultations:  cards  Discharge Exam: Filed Vitals:   04/07/15 2300 04/08/15 0527  BP: 116/73 107/55  Pulse: 91 75  Temp: 98.1 F (36.7 C) 98 F (36.7 C)  Resp: 18 16    General: awake, NAD  Discharge Instructions   Discharge Instructions    Diet general    Complete by:  As directed      Discharge instructions    Complete by:  As directed   Stay hydrated     Increase activity slowly    Complete by:  As directed           Current Discharge Medication List    START taking these medications   Details  DULoxetine (CYMBALTA) 20 MG capsule Take 1 capsule (20 mg total) by mouth daily. Qty: 30 capsule, Refills: 0      CONTINUE these medications  which have NOT CHANGED   Details  amphetamine-dextroamphetamine (ADDERALL XR) 30 MG 24 hr capsule Take 30 mg by mouth daily.    amphetamine-dextroamphetamine (ADDERALL) 20 MG tablet Take 20 mg by mouth daily.    Aspirin-Acetaminophen-Caffeine (GOODY HEADACHE PO) Take by mouth.    Ibuprofen (MOTRIN PO) Take by mouth.    Multiple Vitamin (MULTIVITAMIN WITH MINERALS) TABS tablet Take 1 tablet by mouth daily.      STOP taking these medications      AMOXICILLIN PO      SERTRALINE HCL PO        Allergies  Allergen Reactions  . Sulfa Antibiotics     Reaction unknown.  Early childhood hx.       The results of significant diagnostics from this hospitalization (including imaging, microbiology, ancillary and laboratory) are listed below for reference.    Significant Diagnostic Studies: Ct Head Wo Contrast  04/06/2015  CLINICAL DATA:  Altered mental status. Drug overdose. Initial encounter. EXAM: CT HEAD WITHOUT CONTRAST TECHNIQUE: Contiguous axial images were obtained from the base of the skull through the vertex without intravenous contrast. COMPARISON:  None. FINDINGS: The brain appears normal without hemorrhage, infarct, mass lesion, mass effect, midline shift or abnormal extra-axial fluid collection. No hydrocephalus or pneumocephalus. The calvarium is intact. Imaged paranasal sinuses and mastoid air cells are clear. IMPRESSION: Negative head CT. Electronically Signed   By: Drusilla Kanner M.D.   On: 04/06/2015 16:19   Dg Chest Portable 1 View  04/06/2015  CLINICAL DATA:  Altered mental status. EXAM: PORTABLE CHEST 1 VIEW COMPARISON:  None. FINDINGS: The heart size and mediastinal contours are within normal limits. Both lungs are clear. The visualized skeletal structures are unremarkable. IMPRESSION: No active disease. Electronically Signed   By: Marlan Palau M.D.   On: 04/06/2015 15:28    Microbiology: Recent Results (from the past 240 hour(s))  MRSA PCR Screening     Status: None   Collection Time: 04/06/15  8:40 PM  Result Value Ref Range Status   MRSA by PCR NEGATIVE NEGATIVE Final    Comment:        The GeneXpert MRSA Assay (FDA approved for NASAL specimens only), is one component of a comprehensive MRSA colonization surveillance program. It is not intended to diagnose MRSA infection nor to guide or monitor treatment for MRSA infections.      Labs: Basic Metabolic Panel:  Recent Labs Lab 04/06/15 1540  04/07/15 0328 04/08/15 0523  NA 140 144 138  K 3.7 3.8 3.8  CL 105 111 110  CO2 25 25 22   GLUCOSE 96 86 94  BUN 11 11 8   CREATININE 0.65 0.62 0.55  CALCIUM 9.0 8.7* 8.1*  MG 2.0  --   --    Liver Function Tests:  Recent Labs Lab 04/06/15 1540 04/07/15 0328  AST 14* 12*  ALT 11* 12*  ALKPHOS 49 42  BILITOT 0.8 1.0  PROT 6.9 5.9*  ALBUMIN 4.2 3.7   No results for input(s): LIPASE, AMYLASE in the last 168 hours.  Recent Labs Lab 04/06/15 1555  AMMONIA 36*   CBC:  Recent Labs Lab 04/06/15 1540 04/07/15 0328  WBC 4.7 3.5*  NEUTROABS 2.4 1.3*  HGB 12.8 11.6*  HCT 37.5 34.4*  MCV 89.1 89.6  PLT 204 200   Cardiac Enzymes: No results for input(s): CKTOTAL, CKMB, CKMBINDEX, TROPONINI in the last 168 hours. BNP: BNP (last 3 results) No results for input(s): BNP in the last 8760 hours.  ProBNP (last  3 results) No results for input(s): PROBNP in the last 8760 hours.  CBG:  Recent Labs Lab 04/06/15 1517  GLUCAP 97       Signed:  JESSICA U VANN DO.  [facility] 04/08/2015, 12:09 PM

## 2015-04-09 ENCOUNTER — Encounter (HOSPITAL_COMMUNITY): Payer: Self-pay | Admitting: Vascular Surgery

## 2015-05-19 ENCOUNTER — Ambulatory Visit (HOSPITAL_COMMUNITY): Payer: Self-pay | Admitting: Psychiatry

## 2015-05-25 ENCOUNTER — Other Ambulatory Visit (INDEPENDENT_AMBULATORY_CARE_PROVIDER_SITE_OTHER): Payer: Medicaid Other

## 2015-05-25 ENCOUNTER — Ambulatory Visit (INDEPENDENT_AMBULATORY_CARE_PROVIDER_SITE_OTHER): Payer: Medicaid Other | Admitting: Neurology

## 2015-05-25 ENCOUNTER — Encounter: Payer: Self-pay | Admitting: Neurology

## 2015-05-25 VITALS — BP 122/74 | HR 75 | Ht 64.5 in | Wt 112.0 lb

## 2015-05-25 DIAGNOSIS — F17200 Nicotine dependence, unspecified, uncomplicated: Secondary | ICD-10-CM

## 2015-05-25 DIAGNOSIS — R4182 Altered mental status, unspecified: Secondary | ICD-10-CM | POA: Diagnosis not present

## 2015-05-25 DIAGNOSIS — R202 Paresthesia of skin: Secondary | ICD-10-CM

## 2015-05-25 DIAGNOSIS — R200 Anesthesia of skin: Secondary | ICD-10-CM

## 2015-05-25 LAB — TSH: TSH: 0.22 u[IU]/mL — AB (ref 0.35–4.50)

## 2015-05-25 LAB — VITAMIN B12: Vitamin B-12: 197 pg/mL — ABNORMAL LOW (ref 211–911)

## 2015-05-25 NOTE — Progress Notes (Signed)
NEUROLOGY CONSULTATION NOTE  [name] MRN: [id number] DOB: 01-Nov-1985  Referring provider: Hospital Primary care provider: no  Reason for consult:  Altered mental status  HISTORY OF PRESENT ILLNESS: [name] is a 30 year old right-handed female with ADHD, anxiety and history of alcohol and polysubstance abuse who presents for altered mental status.  History obtained by patient and hospital notes.  Labs, EKG, EEG and imaging of head CT personally reviewed.  On 04/06/15, she was found by her neighbor face-down in the lawn.  The patient reports that she was outside with her children working in the garden.  The next thing she remembers was being in the hospital.  She denied any preceding symptoms such as headache, dizziness, tunnel vision or lightheadedness.  When EMS arrived, her pupils were reportedly pinpoint.  She was given naloxone with mild response.  She denied recreational drug or alcohol use.  However, urine tox screen was positive for THC, opioids (which she was taking for tooth pain), benzodiazepines (which she was told could be false positive due to sertraline) and amphetamines (she takes Adderall for ADHD).  Salicylates and acetaminophen levels were negative.  CBC, CMP and ammonia were unremarkable.  CT of head was normal.  EKG showed sinus tachycardia of 118 with prolonged QT interval of QTc 537.  EEG showed normal drowsiness.  2D echo showed EF 65-70%.    For the first week after discharge, she had a couple of episodes of somnolence.  She also has longstanding history of left leg pain.  She reports pain down the left hip, anterior thigh and posterior calf.  She has been evaluated by orthopedists.  Imaging reportedly unremarkable.  She was treated with injections, muscle relaxants and PT, which were not effective.  She also reports occasional numbness and tingling of her hands and feet.  One day, her right hand was numb for 3 days.  It can occur when wakes up at night.   Shaking it out doesn't really help, except for the legs.  She denies back pain.  She has no prior history of seizure, head trauma, meningitis or complicated birth.  PAST MEDICAL HISTORY: Past Medical History  Diagnosis Date  . Depression   . Cystic fibrosis carrier   . Headache(784.0)   . Abnormal Pap smear   . Pilonidal cyst   . HPV (human papilloma virus) anogenital infection   . Alcohol abuse   . Current smoker   . Current every day smoker     PAST SURGICAL HISTORY: Past Surgical History  Procedure Laterality Date  . No past surgeries      MEDICATIONS: Current Outpatient Prescriptions on File Prior to Visit  Medication Sig Dispense Refill  . Aspirin-Acetaminophen-Caffeine (GOODY HEADACHE PO) Take by mouth.    . Ibuprofen (MOTRIN PO) Take by mouth. As needed    . Multiple Vitamin (MULTIVITAMIN WITH MINERALS) TABS tablet Take 1 tablet by mouth daily.     No current facility-administered medications on file prior to visit.    ALLERGIES: Allergies  Allergen Reactions  . Sulfa Antibiotics Other (See Comments)    Unknown reaction.  . Sulfa Antibiotics     Reaction unknown.  Early childhood hx.     FAMILY HISTORY: Family History  Problem Relation Age of Onset  . Hypertension Mother   . Asthma Mother   . Cancer Paternal Uncle     stomach  . Cancer Maternal Grandmother     liver  . Diabetes Maternal Grandmother   . Diabetes  Maternal Grandfather   . Cancer Paternal Grandfather     lung    SOCIAL HISTORY: Social History   Social History  . Marital Status: Married    Spouse Name: N/A  . Number of Children: 2  . Years of Education: N/A   Occupational History  . Hair Stylist    Social History Main Topics  . Smoking status: Current Every Day Smoker -- 0.25 packs/day    Types: Cigarettes  . Smokeless tobacco: Never Used  . Alcohol Use: 0.0 oz/week    0 Standard drinks or equivalent per week     Comment: Glass of wine  . Drug Use: Yes    Special:  Marijuana  . Sexual Activity: Yes    Birth Control/ Protection: None   Other Topics Concern  . Not on file   Social History Narrative   ** Merged History Encounter **        REVIEW OF SYSTEMS: Constitutional: No fevers, chills, or sweats, no generalized fatigue, change in appetite Eyes: No visual changes, double vision, eye pain Ear, nose and throat: No hearing loss, ear pain, nasal congestion, sore throat Cardiovascular: No chest pain, palpitations Respiratory:  No shortness of breath at rest or with exertion, wheezes GastrointestinaI: No nausea, vomiting, diarrhea, abdominal pain, fecal incontinence Genitourinary:  No dysuria, urinary retention or frequency Musculoskeletal:  No neck pain, back pain Integumentary: No rash, pruritus, skin lesions Neurological: as above Psychiatric: No depression, insomnia, anxiety Endocrine: No palpitations, fatigue, diaphoresis, mood swings, change in appetite, change in weight, increased thirst Hematologic/Lymphatic:  No anemia, purpura, petechiae. Allergic/Immunologic: no itchy/runny eyes, nasal congestion, recent allergic reactions, rashes  PHYSICAL EXAM: Filed Vitals:   05/25/15 1057  BP: 122/74  Pulse: 75   General: No acute distress.  Patient appears well-groomed.  Head:  Normocephalic/atraumatic Eyes:  fundi examined but not visualized Neck: supple, no paraspinal tenderness, full range of motion Back: No paraspinal tenderness Heart: regular rate and rhythm Lungs: Clear to auscultation bilaterally. Vascular: No carotid bruits. Neurological Exam: Mental status: alert and oriented to person, place, and time, recent and remote memory intact, fund of knowledge intact, attention and concentration intact, speech fluent and not dysarthric, language intact. Cranial nerves: CN I: not tested CN II: pupils equal, round and reactive to light, visual fields intact CN III, IV, VI:  full range of motion, no nystagmus, no ptosis CN V: facial  sensation intact CN VII: upper and lower face symmetric CN VIII: hearing intact CN IX, X: gag intact, uvula midline CN XI: sternocleidomastoid and trapezius muscles intact CN XII: tongue midline Bulk & Tone: normal, no fasciculations. Motor:  5/5 throughout  Sensation:  Temperature and vibration sensation intact. . Deep Tendon Reflexes:  2+ throughout, toes downgoing.  Finger to nose testing:  Without dysmetria.  Heel to shin:  Without dysmetria.   Gait:  Normal station and stride.  Able to turn and tandem walk. Romberg negative.  IMPRESSION: Transient altered mental status.  Unclear etiology.  It is not consistent with seizure.  It did not appear consistent with drug overdose.  Polypharmacy is possible, as she was taking Adderal, sertraline, THC and opioid over the past couple of days. Numbness and tingling Tobacco use  PLAN: 1.  For further evaluation of altered mental status, will get MRI of head and 24 hour ambulatory EEG 2.  For numbness and tingling, will check B12, TSH, B6 and NCV-EMG of right upper and left lower extremities. 3.  Informed her of [place] law stating  no driving for six months from episode of unexplained loss of consciousness or awareness 4.  Smoking cessation 5.  Follow up after testing.  Thank you for allowing me to take part in the care of this patient.  Shon Millet, DO

## 2015-05-25 NOTE — Patient Instructions (Addendum)
At this point, it is unclear what exactly happened.  We will perform further tests. 1.  We will get MRI of brain 2.  We will get 24 hour EEG  For numbness and tingling,  1.  We will check B12, B6, TSH 2.  We will check a nerve study of the right upper extremity and left lower extremity  As per [employer], no driving for 6 months from episode of loss of awareness  Follow up afterwards.

## 2015-05-26 ENCOUNTER — Telehealth: Payer: Self-pay

## 2015-05-26 DIAGNOSIS — R7989 Other specified abnormal findings of blood chemistry: Secondary | ICD-10-CM

## 2015-05-26 NOTE — Telephone Encounter (Signed)
Results were left on pt's voicemail, with instructions to call back with any questions or concerns in relation to results.   

## 2015-05-26 NOTE — Telephone Encounter (Signed)
-----   Message from Drema Dallas, DO sent at 05/25/2015  7:12 PM EDT ----- Regarding: FW: TSH is borderline low.  I would like to check free T3 and T4  B12 level is low, which may be a cause of numbness.  I would start B12 injections. daily for 7 days, then weekly for 4 weeks, then monthly for 1 year.  Recheck B12 in 6 months ----- Message -----    From: [facility]    Sent: 05/25/2015   4:17 PM      To: Drema Dallas, DO

## 2015-05-27 ENCOUNTER — Telehealth: Payer: Self-pay

## 2015-05-27 DIAGNOSIS — R4182 Altered mental status, unspecified: Secondary | ICD-10-CM

## 2015-05-27 NOTE — Telephone Encounter (Signed)
We will check 24 hour ambulatory EEG first.  If the EEG is abnormal, then I would pursue further imaging.

## 2015-05-27 NOTE — Telephone Encounter (Signed)
24 hour EEG ordered.

## 2015-05-27 NOTE — Telephone Encounter (Signed)
Pt's MRI w/o contrast was not approved. [employer] fax states: "clinical information reviewed show that the same or similar testing to the one requested was previously performed. The resutls of that prior imaging were provided, but they do not show why results are not sufficient for the evaluation of the current clinical condition"  Previous imaging that fax is talking about is the  CT of head w/o contrast performed on 04/06/15. Please advise.

## 2015-05-28 ENCOUNTER — Telehealth: Payer: Self-pay

## 2015-05-28 DIAGNOSIS — G35 Multiple sclerosis: Secondary | ICD-10-CM

## 2015-05-28 DIAGNOSIS — R200 Anesthesia of skin: Secondary | ICD-10-CM

## 2015-05-28 DIAGNOSIS — R202 Paresthesia of skin: Secondary | ICD-10-CM

## 2015-05-28 NOTE — Telephone Encounter (Signed)
Received fax from after hours phone service. States that pt called last night with concerns that symptoms (hands/feet have numb/tingling, chronic pain, fainting episodes followed by lethargy) are related to MS. Attempted to reach pt to gather more info. Please advise.

## 2015-05-28 NOTE — Telephone Encounter (Signed)
Order placed. WIll attempt authorization again.

## 2015-05-28 NOTE — Telephone Encounter (Signed)
These symptoms all for MRI of brain with and without contrast to evaluate for MS in a [place] woman with recurrent numbness and tingling, as I have no explanation for these symptoms.  These findings would not be seen on CT of head.

## 2015-05-29 ENCOUNTER — Telehealth: Payer: Self-pay

## 2015-05-29 LAB — VITAMIN B6: Vitamin B6: 18.2 ng/mL (ref 2.1–21.7)

## 2015-05-29 NOTE — Telephone Encounter (Signed)
-----   Message from Drema Dallas, DO sent at 05/29/2015  9:36 AM EDT ----- B12 level is low.  Low B12 may be a cause of numbness.  I would start injections.  daily for 7 days, then weekly for 4 weeks, then monthly for 1 year. TSH is slightly low.  This may suggest increased thyroid function (opposite of what I would expect with her symptoms), however it may be of no clinical significance.  I would like to check rest of thyroid panel.

## 2015-05-29 NOTE — Telephone Encounter (Signed)
Left message on machine for pt to return call to the office.  

## 2015-06-01 NOTE — Telephone Encounter (Signed)
Left message on machine for pt to return call to the office.  

## 2015-06-02 ENCOUNTER — Ambulatory Visit (HOSPITAL_COMMUNITY): Payer: Medicaid Other

## 2015-06-04 ENCOUNTER — Ambulatory Visit (HOSPITAL_COMMUNITY): Payer: Self-pay | Admitting: Psychiatry

## 2015-06-09 ENCOUNTER — Telehealth: Payer: Self-pay

## 2015-06-09 ENCOUNTER — Ambulatory Visit (INDEPENDENT_AMBULATORY_CARE_PROVIDER_SITE_OTHER): Payer: Self-pay | Admitting: Neurology

## 2015-06-09 ENCOUNTER — Telehealth: Payer: Self-pay | Admitting: Neurology

## 2015-06-09 DIAGNOSIS — R202 Paresthesia of skin: Secondary | ICD-10-CM

## 2015-06-09 DIAGNOSIS — R200 Anesthesia of skin: Secondary | ICD-10-CM

## 2015-06-09 DIAGNOSIS — G5601 Carpal tunnel syndrome, right upper limb: Secondary | ICD-10-CM

## 2015-06-09 NOTE — Procedures (Signed)
[facility]  [address] [place], [address]  [place], [place] [address] Tel: [phone] Fax:  [phone] Test Date:  06/09/2015  Patient: [name] DOB: 05-Jun-1985 Physician: Nita Sickle, DO  Sex: Female Height:  Ref Phys: Shon Millet  ID#: [id number]   Technician: Judie Petit. Dean   Patient Complaints: This is a 30 year old female referred for evaluation of her right hand numbness and left leg numbness and pain.  NCV & EMG Findings: Extensive electrodiagnostic testing of the right upper extremity and left lower extremity shows: 1. Right median and ulnar sensory responses are within normal limits. Right palmar sensory response shows prolonged latency. 2. Right median and ulnar motor responses are within normal limits. 3. Left sural and superficial peroneal sensory responses are within normal limits. 4. Left peroneal and tibial motor responses are within normal limits. Left tibial H reflex is also within normal limits. 5. There is no evidence of active or chronic motor axon loss changes affecting any of the tested muscles. Motor unit configuration and recruitment pattern is within normal limits.  Impression: 1. Right median neuropathy at or distal to the wrist, consistent with clinical diagnosis of carpal tunnel syndrome. Overall, these findings are mild in degree electrically. 2. There is no evidence of a lumbosacral radiculopathy or sensorimotor polyneuropathy affecting the left lower extremity.   ___________________________ Nita Sickle, DO    Nerve Conduction Studies Anti Sensory Summary Table   Site NR Peak (ms) Norm Peak (ms) P-T Amp (V) Norm P-T Amp  Right Median Anti Sensory (2nd Digit)  32.1C  Wrist    3.4 <3.4 36.5 >20  Left Sup Peroneal Anti Sensory (Ant Lat Mall)  32.1C  12 cm    3.3 <4.5 14.9 >5  Left Sural Anti Sensory (Lat Mall)  32.1C  Calf    3.2 <4.5 27.4 >5  Right Ulnar Anti Sensory (5th Digit)  32.1C  Wrist    2.8 <3.1 47.2 >12   Motor  Summary Table   Site NR Onset (ms) Norm Onset (ms) O-P Amp (mV) Norm O-P Amp Site1 Site2 Delta-0 (ms) Dist (cm) Vel (m/s) Norm Vel (m/s)  Right Median Motor (Abd Poll Brev)  32.1C  Wrist    3.4 <3.9 10.4 >6 Elbow Wrist 4.0 22.0 55 >50  Elbow    7.4  10.4         Left Peroneal Motor (Ext Dig Brev)  32.1C  Ankle    3.4 <5.5 6.7 >3 B Fib Ankle 6.6 33.0 50 >40  B Fib    10.0  6.2  Poplt B Fib 1.6 10.0 63 >40  Poplt    11.6  6.0         Left Tibial Motor (Abd Hall Brev)  32.1C  Ankle    3.7 <6.0 13.5 >8 Knee Ankle 7.2 37.0 51 >40  Knee    10.9  12.2         Right Ulnar Motor (Abd Dig Minimi)  32.1C  Wrist    2.3 <3.1 8.6 >7 B Elbow Wrist 2.9 17.0 59 >50  B Elbow    5.2  8.6  A Elbow B Elbow 1.8 10.0 56 >50  A Elbow    7.0  8.6          Comparison Summary Table   Site NR Peak (ms) Norm Peak (ms) P-T Amp (V) Site1 Site2 Delta-P (ms) Norm Delta (ms)  Right Median/Ulnar Palm Comparison (Wrist - 8cm)  32.1C  Median [employer]  2.0 <2.2 72.7 Median Palm Ulnar Palm 0.6   Ulnar Palm    1.4 <2.2 32.3       H Reflex Studies   NR H-Lat (ms) Lat Norm (ms) L-R H-Lat (ms) M-Lat (ms) HLat-MLat (ms)  Left Tibial (Gastroc)  32.1C     29.52 <35  4.49 25.03   EMG   Side Muscle Ins Act Fibs Psw Fasc Number Recrt Dur Dur. Amp Amp. Poly Poly. Comment  Right 1stDorInt Nml Nml Nml Nml Nml Nml Nml Nml Nml Nml Nml Nml N/A  Left AntTibialis Nml Nml Nml Nml Nml Nml Nml Nml Nml Nml Nml Nml N/A  Right AbdPolBrevis Nml Nml Nml Nml Nml Nml Nml Nml Nml Nml Nml Nml N/A  Right Ext Indicis Nml Nml Nml Nml Nml Nml Nml Nml Nml Nml Nml Nml N/A  Right PronatorTeres Nml Nml Nml Nml Nml Nml Nml Nml Nml Nml Nml Nml N/A  Right Biceps Nml Nml Nml Nml Nml Nml Nml Nml Nml Nml Nml Nml N/A  Right Triceps Nml Nml Nml Nml Nml Nml Nml Nml Nml Nml Nml Nml N/A  Right Deltoid Nml Nml Nml Nml Nml Nml Nml Nml Nml Nml Nml Nml N/A  Left Gastroc Nml Nml Nml Nml Nml Nml Nml Nml Nml Nml Nml Nml N/A  Left Flex Dig Long Nml Nml Nml Nml  Nml Nml Nml Nml Nml Nml Nml Nml N/A  Left RectFemoris Nml Nml Nml Nml Nml Nml Nml Nml Nml Nml Nml Nml N/A  Left GluteusMed Nml Nml Nml Nml Nml Nml Nml Nml Nml Nml Nml Nml N/A      Waveforms:

## 2015-06-09 NOTE — Telephone Encounter (Signed)
Pt given G.I. Number. Authorization has been done.

## 2015-06-09 NOTE — Telephone Encounter (Signed)
Message relayed to patient. Verbalized understanding and denied questions. Pt stated she would have them send MRI lumbar over.

## 2015-06-09 NOTE — Telephone Encounter (Signed)
[facility] patient.  

## 2015-06-09 NOTE — Telephone Encounter (Signed)
[name] August 29, 1985 she was wanting to see about having her MRI scheduled. Her number is [phone]. Thank you

## 2015-06-09 NOTE — Telephone Encounter (Signed)
-----   Message from Drema Dallas, DO sent at 06/09/2015  1:45 PM EDT ----- Nerve study doesn't reveal any cause for numbness.  However, the right hand does show evidence of mild carpal tunnel syndrome.  I recommend that she wear a wrist splint at night on the right to see if she has less numbness in the hand.  The leg does not reveal any nerve problems.  I would like to get any MRI reports from the orthopedist that she saw, specifically MRI of lumbar spine.
